# Patient Record
Sex: Male | Born: 1975 | Race: White | Hispanic: No | Marital: Married | Smoking: Never smoker
Health system: Southern US, Community
[De-identification: ages and names within clinical notes are randomized; demographics above are authoritative.]

---

## 2006-08-31 ENCOUNTER — Ambulatory Visit: Payer: Self-pay | Admitting: Family Medicine

## 2006-09-30 ENCOUNTER — Ambulatory Visit: Payer: Self-pay | Admitting: Family Medicine

## 2007-02-06 ENCOUNTER — Ambulatory Visit: Payer: Self-pay | Admitting: Family Medicine

## 2012-06-22 DIAGNOSIS — F988 Other specified behavioral and emotional disorders with onset usually occurring in childhood and adolescence: Secondary | ICD-10-CM | POA: Insufficient documentation

## 2013-10-24 ENCOUNTER — Emergency Department (HOSPITAL_COMMUNITY)
Admission: EM | Admit: 2013-10-24 | Discharge: 2013-10-24 | Disposition: A | Discharge status: Home / Self Care | Payer: Self-pay | Attending: Emergency Medicine | Admitting: Emergency Medicine

## 2013-10-24 ENCOUNTER — Encounter (HOSPITAL_COMMUNITY): Payer: Self-pay | Admitting: Emergency Medicine

## 2013-10-24 ENCOUNTER — Emergency Department (HOSPITAL_COMMUNITY): Payer: Self-pay

## 2013-10-24 DIAGNOSIS — Z79899 Other long term (current) drug therapy: Secondary | ICD-10-CM | POA: Insufficient documentation

## 2013-10-24 DIAGNOSIS — N201 Calculus of ureter: Secondary | ICD-10-CM | POA: Insufficient documentation

## 2013-10-24 DIAGNOSIS — Z91040 Latex allergy status: Secondary | ICD-10-CM | POA: Insufficient documentation

## 2013-10-24 LAB — COMPREHENSIVE METABOLIC PANEL
AST: 22 U/L (ref 0–37)
Albumin: 4.6 g/dL (ref 3.5–5.2)
Alkaline Phosphatase: 67 U/L (ref 39–117)
Calcium: 9.3 mg/dL (ref 8.4–10.5)
Chloride: 99 mEq/L (ref 96–112)
GFR calc Af Amer: >90 mL/min (ref >90)
Potassium: 3.9 mEq/L (ref 3.5–5.1)
Total Bilirubin: 0.4 mg/dL (ref 0.3–1.2)

## 2013-10-24 LAB — CBC WITH DIFFERENTIAL/PLATELET
Basophils Absolute: 0 10*3/uL (ref 0.0–0.1)
Basophils Relative: 0 % (ref 0–1)
Eosinophils Relative: 2 % (ref 0–5)
Hemoglobin: 15.7 g/dL (ref 13.0–17.0)
Lymphocytes Relative: 21 % (ref 12–46)
MCHC: 36.2 g/dL — ABNORMAL HIGH (ref 30.0–36.0)
MCV: 87.5 fL (ref 78.0–100.0)
Monocytes Relative: 7 % (ref 3–12)
Neutro Abs: 7.7 10*3/uL (ref 1.7–7.7)
Neutrophils Relative %: 70 % (ref 43–77)
RBC: 4.96 MIL/uL (ref 4.22–5.81)
RDW: 12.3 % (ref 11.5–15.5)
WBC: 11 10*3/uL — ABNORMAL HIGH (ref 4.0–10.5)

## 2013-10-24 LAB — URINALYSIS, ROUTINE W REFLEX MICROSCOPIC
Glucose, UA: NEGATIVE mg/dL
Hgb urine dipstick: NEGATIVE
Ketones, ur: 15 mg/dL — AB
Nitrite: NEGATIVE
Protein, ur: NEGATIVE mg/dL
Specific Gravity, Urine: 1.029 (ref 1.005–1.030)
Urobilinogen, UA: 1 mg/dL (ref 0.0–1.0)

## 2013-10-24 MED ORDER — HYDROMORPHONE HCL PF 1 MG/ML IJ SOLN
1.0000 mg | Freq: Once | INTRAMUSCULAR | Status: AC
  Administered 2013-10-24: 1 mg via INTRAVENOUS
  Filled 2013-10-24: qty 1

## 2013-10-24 MED ORDER — ONDANSETRON HCL 8 MG PO TABS: 8.0000 mg | ORAL_TABLET | Freq: Three times a day (TID) | ORAL | Status: DC | PRN

## 2013-10-24 MED ORDER — ONDANSETRON HCL 4 MG/2ML IJ SOLN
4.0000 mg | Freq: Once | INTRAMUSCULAR | Status: AC
  Administered 2013-10-24: 4 mg via INTRAVENOUS
  Filled 2013-10-24: qty 2

## 2013-10-24 MED ORDER — OXYCODONE-ACETAMINOPHEN 5-325 MG PO TABS: 2.0000 | ORAL_TABLET | ORAL | Status: DC | PRN

## 2013-10-24 MED ORDER — FENTANYL CITRATE 0.05 MG/ML IJ SOLN
INTRAMUSCULAR | Status: AC
  Filled 2013-10-24: qty 2

## 2013-10-24 MED ORDER — FENTANYL CITRATE 0.05 MG/ML IJ SOLN
50.0000 ug | Freq: Once | INTRAMUSCULAR | Status: AC
  Administered 2013-10-24: 50 ug via INTRAVENOUS

## 2013-10-24 NOTE — ED Provider Notes (Signed)
CSN: [id number]     Arrival date & time 10/24/13  1828 History   First MD Initiated Contact with Patient 10/24/13 1847     Chief Complaint  Patient presents with  . Flank Pain    Patient is a 37 y.o. male presenting with flank pain. The history is provided by the patient.  Flank Pain This is a new problem. The current episode started 3 to 5 hours ago. The problem occurs constantly. The problem has been gradually worsening. Associated symptoms include abdominal pain. Pertinent negatives include no chest pain and no shortness of breath. Exacerbated by: palpation. Nothing relieves the symptoms. He has tried rest for the symptoms. The treatment provided no relief.  pt reports sudden onset of right flank pain/RLQ pain earlier He has never had this before No h/o kidney stones He reports nausea No vomiting   PMH - none History reviewed. No pertinent past surgical history. History reviewed. No pertinent family history. History  Substance Use Topics  . Smoking status: Never Smoker   . Smokeless tobacco: Not on file  . Alcohol Use: No    Review of Systems  Constitutional: Negative for fever.  Respiratory: Negative for shortness of breath.   Cardiovascular: Negative for chest pain.  Gastrointestinal: Positive for abdominal pain.  Genitourinary: Positive for urgency and flank pain. Negative for dysuria.  Neurological: Negative for weakness.  All other systems reviewed and are negative.    Allergies  Latex  Home Medications   Current Outpatient Rx  Name  Route  Sig  Dispense  Refill  . amphetamine-dextroamphetamine (ADDERALL XR) 30 MG 24 hr capsule   Oral   Take 30 mg by mouth daily.         . citalopram (CELEXA) 10 MG tablet   Oral   Take 10 mg by mouth daily.         Marland Kitchen guaiFENesin (MUCINEX) 600 MG 12 hr tablet   Oral   Take 600 mg by mouth daily as needed for cough or to loosen phlegm.         Marland Kitchen ibuprofen (ADVIL,MOTRIN) 200 MG tablet   Oral   Take 400 mg by  mouth every 6 (six) hours as needed for headache or mild pain.          BP 121/68  Pulse 82  Temp(Src) 98.2 F (36.8 C) (Oral)  Resp 18  SpO2 92% Physical Exam CONSTITUTIONAL: Well developed/well nourished HEAD: Normocephalic/atraumatic EYES: EOMI/PERRL ENMT: Mucous membranes moist NECK: supple no meningeal signs SPINE:entire spine nontender CV: S1/S2 noted, no murmurs/rubs/gallops noted LUNGS: Lungs are clear to auscultation bilaterally, no apparent distress ABDOMEN: soft, mild RLQ tenderness noted, no rebound or guarding WU:[id number] cva tenderness, no inguinal hernia, no testicular tenderness.   NEURO: Pt is awake/alert, moves all extremitiesx4 EXTREMITIES: pulses normal, full ROM SKIN: warm, color normal PSYCH: no abnormalities of mood noted  ED Course  Procedures (including critical care time)  8:39 PM Pt uncomfortable appearing, will proceed with Ct imaging  Pt improved in the Ed We discussed CT findings and need for outpatient followup Pt agreeable with plan Labs Review Labs Reviewed  CBC WITH DIFFERENTIAL - Abnormal; Notable for the following:    WBC 11.0 (*)    MCHC 36.2 (*)    All other components within normal limits  COMPREHENSIVE METABOLIC PANEL - Abnormal; Notable for the following:    Glucose, Bld 119 (*)    All other components within normal limits  URINALYSIS, ROUTINE W REFLEX MICROSCOPIC - Abnormal;  Notable for the following:    Ketones, ur 15 (*)    All other components within normal limits   Imaging Review No results found.  EKG Interpretation   None       MDM  No diagnosis found. Nursing notes including past medical history and social history reviewed and considered in documentation Labs/vital reviewed and considered     Joya Gaskins, MD 10/24/13 2336

## 2013-10-24 NOTE — ED Notes (Signed)
Patient return from CT

## 2013-10-24 NOTE — ED Notes (Signed)
Pt c/o R sided ABD pain, R flank pain, and dysuria since 1600 today

## 2013-10-24 NOTE — ED Notes (Signed)
Patient transported to CT 

## 2013-10-24 NOTE — ED Notes (Signed)
Pt c/o RLQ pain starting today; pt sts started today with nausea; pt appears pale and diaphoretic

## 2013-10-24 NOTE — ED Notes (Signed)
MD at bedside. 

## 2014-06-18 DIAGNOSIS — E785 Hyperlipidemia, unspecified: Secondary | ICD-10-CM | POA: Insufficient documentation

## 2014-10-04 ENCOUNTER — Encounter (HOSPITAL_COMMUNITY): Payer: Self-pay | Admitting: Emergency Medicine

## 2014-10-04 ENCOUNTER — Emergency Department (HOSPITAL_COMMUNITY)
Admission: EM | Admit: 2014-10-04 | Discharge: 2014-10-04 | Disposition: A | Discharge status: Home / Self Care | Payer: 59 | Source: Home / Self Care | Attending: Emergency Medicine | Admitting: Emergency Medicine

## 2014-10-04 DIAGNOSIS — N200 Calculus of kidney: Secondary | ICD-10-CM

## 2014-10-04 LAB — POCT I-STAT, CHEM 8
BUN: 14 mg/dL (ref 6–23)
CALCIUM ION: 1.18 mmol/L (ref 1.12–1.23)
Chloride: 103 mEq/L (ref 96–112)
Creatinine, Ser: 1 mg/dL (ref 0.50–1.35)
Glucose, Bld: 99 mg/dL (ref 70–99)
HCT: 49 % (ref 39.0–52.0)
Hemoglobin: 16.7 g/dL (ref 13.0–17.0)
Potassium: 4.2 mEq/L (ref 3.7–5.3)
Sodium: 137 mEq/L (ref 137–147)
TCO2: 23 mmol/L (ref 0–100)

## 2014-10-04 LAB — CBC WITH DIFFERENTIAL/PLATELET
BASOS PCT: 1 % (ref 0–1)
Basophils Absolute: 0.1 10*3/uL (ref 0.0–0.1)
EOS PCT: 3 % (ref 0–5)
Eosinophils Absolute: 0.3 10*3/uL (ref 0.0–0.7)
HEMATOCRIT: 45.4 % (ref 39.0–52.0)
HEMOGLOBIN: 16.3 g/dL (ref 13.0–17.0)
Lymphocytes Relative: 27 % (ref 12–46)
Lymphs Abs: 2.2 10*3/uL (ref 0.7–4.0)
MCH: 31.7 pg (ref 26.0–34.0)
MCHC: 35.9 g/dL (ref 30.0–36.0)
MCV: 88.3 fL (ref 78.0–100.0)
MONO ABS: 0.8 10*3/uL (ref 0.1–1.0)
Monocytes Relative: 9 % (ref 3–12)
Neutro Abs: 5 10*3/uL (ref 1.7–7.7)
Neutrophils Relative %: 60 % (ref 43–77)
Platelets: 293 10*3/uL (ref 150–400)
RBC: 5.14 MIL/uL (ref 4.22–5.81)
RDW: 12.3 % (ref 11.5–15.5)
WBC: 8.3 10*3/uL (ref 4.0–10.5)

## 2014-10-04 LAB — POCT URINALYSIS DIP (DEVICE)
Bilirubin Urine: NEGATIVE
Glucose, UA: NEGATIVE mg/dL
HGB URINE DIPSTICK: NEGATIVE
Ketones, ur: NEGATIVE mg/dL
Leukocytes, UA: NEGATIVE
Nitrite: NEGATIVE
PROTEIN: NEGATIVE mg/dL
SPECIFIC GRAVITY, URINE: 1.02 (ref 1.005–1.030)
UROBILINOGEN UA: 0.2 mg/dL (ref 0.0–1.0)
pH: 5.5 (ref 5.0–8.0)

## 2014-10-04 MED ORDER — HYDROMORPHONE HCL 1 MG/ML IJ SOLN
INTRAMUSCULAR | Status: AC
  Filled 2014-10-04: qty 1

## 2014-10-04 MED ORDER — ONDANSETRON 4 MG PO TBDP
8.0000 mg | ORAL_TABLET | Freq: Once | ORAL | Status: AC
  Administered 2014-10-04: 8 mg via ORAL

## 2014-10-04 MED ORDER — OXYCODONE-ACETAMINOPHEN 5-325 MG PO TABS: ORAL_TABLET | ORAL | Status: DC

## 2014-10-04 MED ORDER — ONDANSETRON 4 MG PO TBDP
ORAL_TABLET | ORAL | Status: AC
  Filled 2014-10-04: qty 2

## 2014-10-04 MED ORDER — ONDANSETRON 8 MG PO TBDP: 8.0000 mg | ORAL_TABLET | Freq: Three times a day (TID) | ORAL | Status: DC | PRN

## 2014-10-04 MED ORDER — KETOROLAC TROMETHAMINE 60 MG/2ML IM SOLN
INTRAMUSCULAR | Status: AC
  Filled 2014-10-04: qty 2

## 2014-10-04 MED ORDER — TAMSULOSIN HCL 0.4 MG PO CAPS: 0.4000 mg | ORAL_CAPSULE | Freq: Every day | ORAL | Status: DC

## 2014-10-04 MED ORDER — KETOROLAC TROMETHAMINE 60 MG/2ML IM SOLN
60.0000 mg | Freq: Once | INTRAMUSCULAR | Status: AC
Start: 2014-10-04 — End: 2014-10-04
  Administered 2014-10-04: 60 mg via INTRAMUSCULAR

## 2014-10-04 MED ORDER — HYDROMORPHONE HCL 1 MG/ML IJ SOLN
2.0000 mg | Freq: Once | INTRAMUSCULAR | Status: AC
  Administered 2014-10-04: 2 mg via INTRAMUSCULAR

## 2014-10-04 NOTE — ED Notes (Addendum)
C/o right flank pain onset 2 hours ago; radiates to the back Sx also include: urinary retention, chills, diaphoresis Hx kidney stones Denies fevers Alert, no signs of acute distress.

## 2014-10-04 NOTE — Discharge Instructions (Signed)

## 2014-10-04 NOTE — ED Provider Notes (Signed)
Chief Complaint   Back Pain   History of Present Illness   Peter Cline is a 38 year old male who 2 hours ago experienced sudden onset of right flank pain rating towards the right CVA area while running to work. He denies any injury. The pain radiated to the right lower back. The patient has a history kidney stone on the right side a year ago and this feels identical to his kidney stone. He's felt somewhat chilled and sweaty but not had any fever. He denies any dysuria, frequency, urgency, hematuria. He's had a little bit of difficulty urinating. He denies any nausea or vomiting. He's had no constipation, diarrhea, blood in the stool.  Review of Systems   Other than as noted above, the patient denies any of the following symptoms: Constitutional:  No fever, chills, weight loss or anorexia. Abdomen:  No nausea, vomiting, hematememesis, melena, diarrhea, or hematochezia. GU:  No dysuria, frequency, urgency, or hematuria.  No testicular pain or swelling.  PMFSH   Past medical history, family history, social history, meds, and allergies were reviewed.   Physical Examination     Vital signs:  BP 141/90  Pulse 102  Temp(Src) 98.3 F (36.8 C) (Oral)  Resp 18  SpO2 100% Gen:  Alert, oriented, in no distress. Lungs:  Breath sounds clear and equal bilaterally.  No wheezes, rales or rhonchi. Heart:  Regular rhythm.  No gallops or murmers.   Abdomen:  Abdomen was soft, flat, nondistended. No organomegaly or mass. Bowel sounds are normal. He has pain to palpation in the right flank without guarding or rebound. Also has slight pain in the right upper quadrant and right lower quadrant without guarding or rebound. No pain in the left side. Back: There is CVA tenderness to percussion on the right, none on the left. Skin:  Clear, warm and dry.  No rash.  Labs   Results for orders placed during the hospital encounter of 10/04/14  CBC WITH DIFFERENTIAL      Result Value Ref Range   WBC  8.3  4.0 - 10.5 K/uL   RBC 5.14  4.22 - 5.81 MIL/uL   Hemoglobin 16.3  13.0 - 17.0 g/dL   HCT 21.345.4  08.639.0 - 57.852.0 %   MCV 88.3  78.0 - 100.0 fL   MCH 31.7  26.0 - 34.0 pg   MCHC 35.9  30.0 - 36.0 g/dL   RDW 46.912.3  62.911.5 - 52.815.5 %   Platelets 293  150 - 400 K/uL   Neutrophils Relative % 60  43 - 77 %   Neutro Abs 5.0  1.7 - 7.7 K/uL   Lymphocytes Relative 27  12 - 46 %   Lymphs Abs 2.2  0.7 - 4.0 K/uL   Monocytes Relative 9  3 - 12 %   Monocytes Absolute 0.8  0.1 - 1.0 K/uL   Eosinophils Relative 3  0 - 5 %   Eosinophils Absolute 0.3  0.0 - 0.7 K/uL   Basophils Relative 1  0 - 1 %   Basophils Absolute 0.1  0.0 - 0.1 K/uL  POCT I-STAT, CHEM 8      Result Value Ref Range   Sodium 137  137 - 147 mEq/L   Potassium 4.2  3.7 - 5.3 mEq/L   Chloride 103  96 - 112 mEq/L   BUN 14  6 - 23 mg/dL   Creatinine, Ser 4.131.00  0.50 - 1.35 mg/dL   Glucose, Bld 99  70 -  99 mg/dL   Calcium, Ion 1.611.18  0.961.12 - 1.23 mmol/L   TCO2 23  0 - 100 mmol/L   Hemoglobin 16.7  13.0 - 17.0 g/dL   HCT 04.549.0  40.939.0 - 81.152.0 %  POCT URINALYSIS DIP (DEVICE)      Result Value Ref Range   Glucose, UA NEGATIVE  NEGATIVE mg/dL   Bilirubin Urine NEGATIVE  NEGATIVE   Ketones, ur NEGATIVE  NEGATIVE mg/dL   Specific Gravity, Urine 1.020  1.005 - 1.030   Hgb urine dipstick NEGATIVE  NEGATIVE   pH 5.5  5.0 - 8.0   Protein, ur NEGATIVE  NEGATIVE mg/dL   Urobilinogen, UA 0.2  0.0 - 1.0 mg/dL   Nitrite NEGATIVE  NEGATIVE   Leukocytes, UA NEGATIVE  NEGATIVE    Course in Urgent Care Center   The following medications were given:  Medications  HYDROmorphone (DILAUDID) injection 2 mg (2 mg Intramuscular Given 10/04/14 1834)  ketorolac (TORADOL) injection 60 mg (60 mg Intramuscular Given 10/04/14 1835)  ondansetron (ZOFRAN-ODT) disintegrating tablet 8 mg (8 mg Oral Given 10/04/14 1835)    The patient felt better after the above medications.  Assessment   The encounter diagnosis was Kidney stone.  Differential diagnosis  includes gastroenteritis, diverticulitis, early appendicitis, pancreatitis, or gallstones. Appendicitis, diverticulitis, or gallstones are unlikely given normal CBC. The normal UA does not preclude a kidney stone, since over third of people with proven kidney stones will have a normal urine.  Plan     1.  Meds:  The following meds were prescribed:   New Prescriptions   ONDANSETRON (ZOFRAN ODT) 8 MG DISINTEGRATING TABLET    Take 1 tablet (8 mg total) by mouth every 8 (eight) hours as needed for nausea.   OXYCODONE-ACETAMINOPHEN (PERCOCET) 5-325 MG PER TABLET    1 to 2 tablets every 6 hours as needed for pain.   TAMSULOSIN (FLOMAX) 0.4 MG CAPS CAPSULE    Take 1 capsule (0.4 mg total) by mouth daily.    2.  Patient Education/Counseling:  The patient was given appropriate handouts, self care instructions, and instructed in symptomatic relief.  He is to rest this weekend, get extra fluids, and strain his urine.  3.  Follow up:  The patient was told to follow up here if no better in 2 to 3 days, or sooner if becoming worse in any way, and given some red flag symptoms such as worsening pain, persistent vomiting, fever, or evidence of GI bleeding which would prompt immediate return. He was given strict return cautions to return if the pain gets worse or if he develops any fever or persistent vomiting. The pain hasn't gone away completely by Monday, he was given the name of urologist to followup with.      Reuben Likesavid C Horris Speros, MD 10/04/14 [phone]

## 2014-10-06 LAB — URINE CULTURE: Special Requests: NORMAL

## 2015-06-21 IMAGING — CT CT ABD-PELV W/O CM
2 of 4 series · 17 of 46 positions shown, 19 images · non-contrast
Comparison: None.

CLINICAL DATA: Right abdomen and flank pain.  Dysuria.

EXAM:
CT ABDOMEN AND PELVIS WITHOUT CONTRAST
TECHNIQUE: Multidetector CT imaging of the abdomen and pelvis was performed
following the standard protocol without intravenous contrast.

[Series 2: routine · axial · 0.86mm/px · z∈[-585,-105]mm · 14 of 106 slices shown, 16 images]
[im 5/106  soft-tissue]
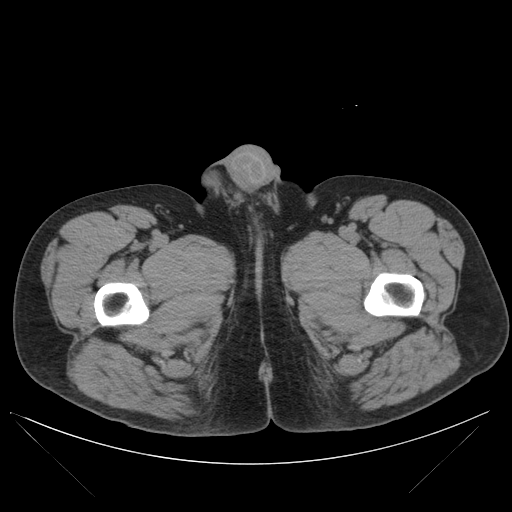
[im 5/106  bone]
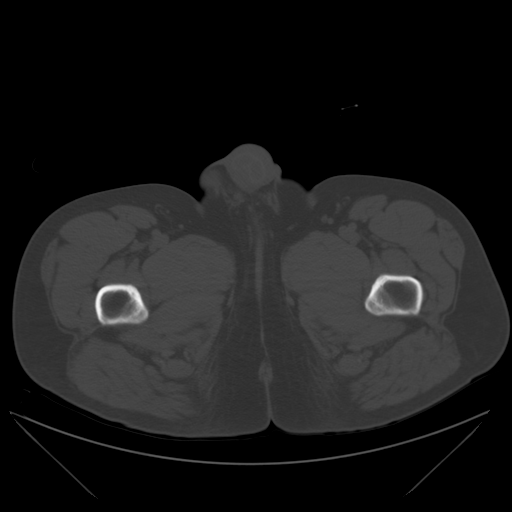
[im 14/106  soft-tissue]
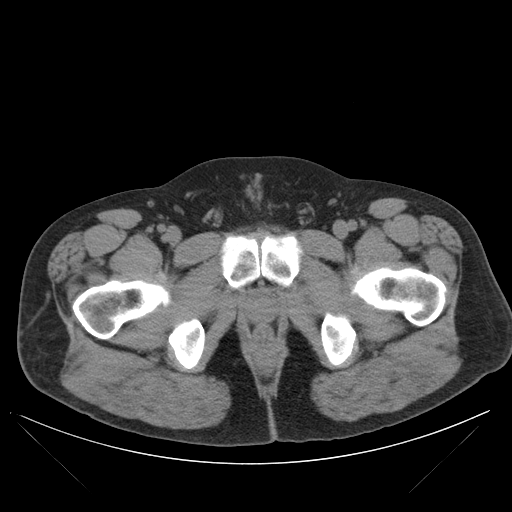
[im 19/106  soft-tissue]
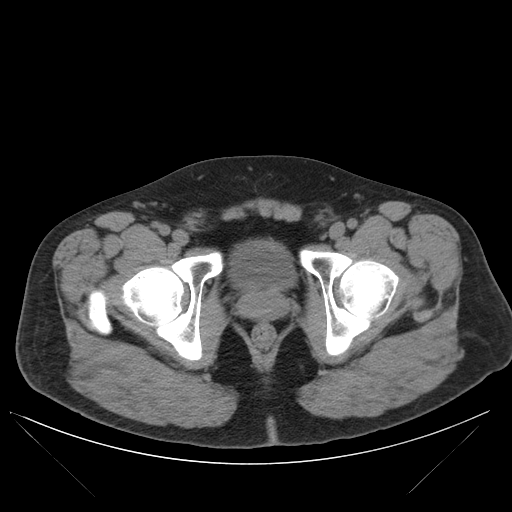
[im 28/106  soft-tissue]
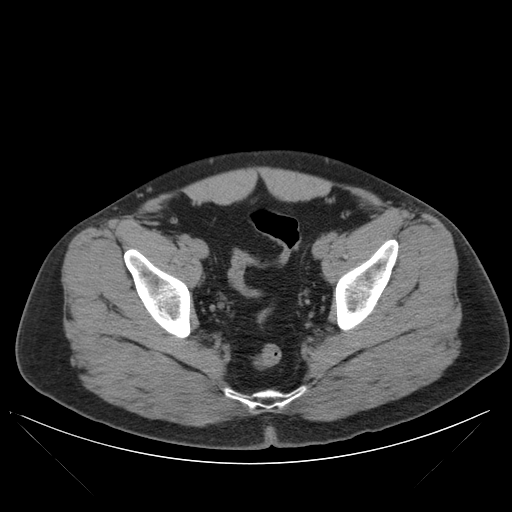
[im 37/106  soft-tissue]
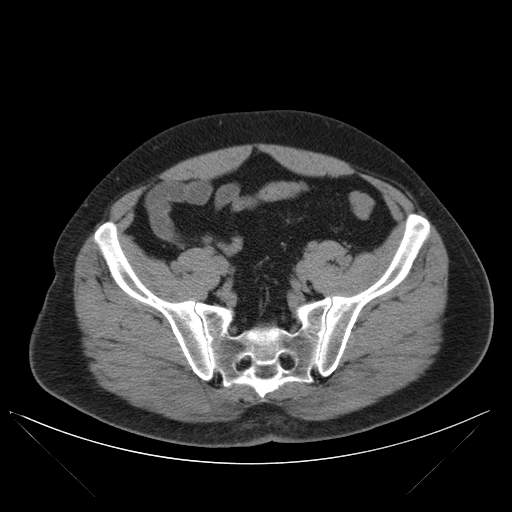
[im 42/106  soft-tissue]
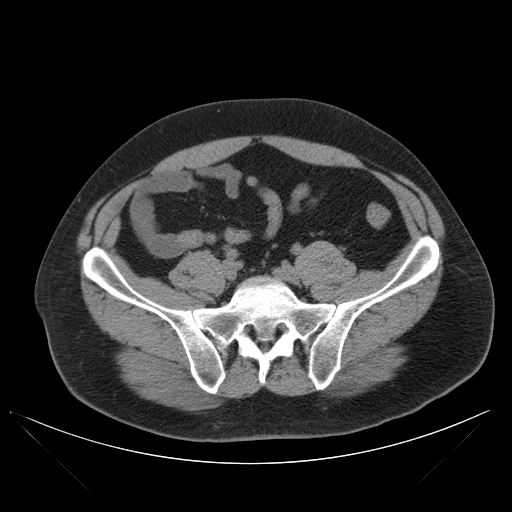
[im 51/106  soft-tissue]
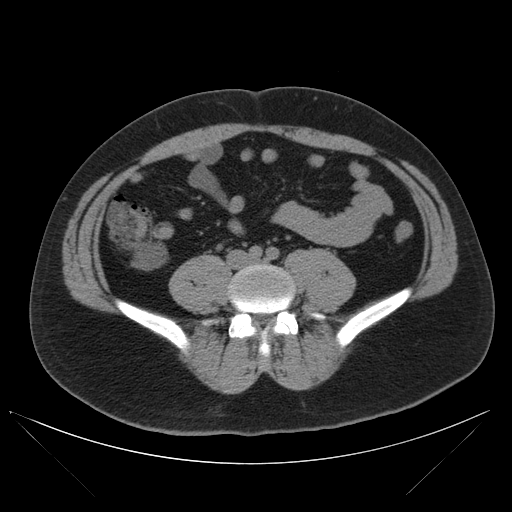
[im 55/106  soft-tissue]
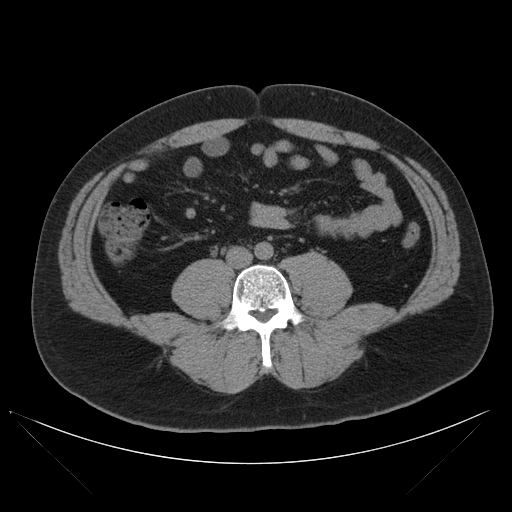
[im 64/106  soft-tissue]
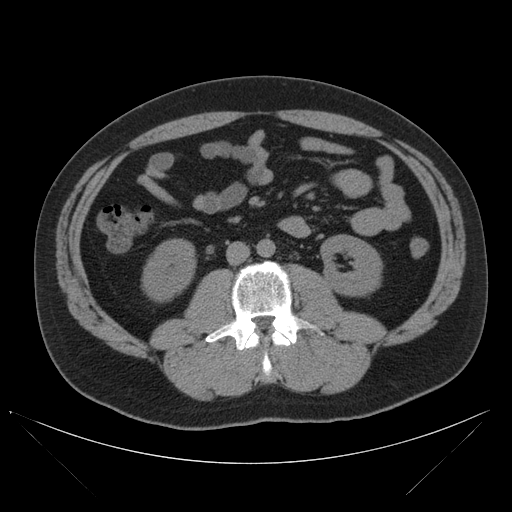
[im 64/106  bone]
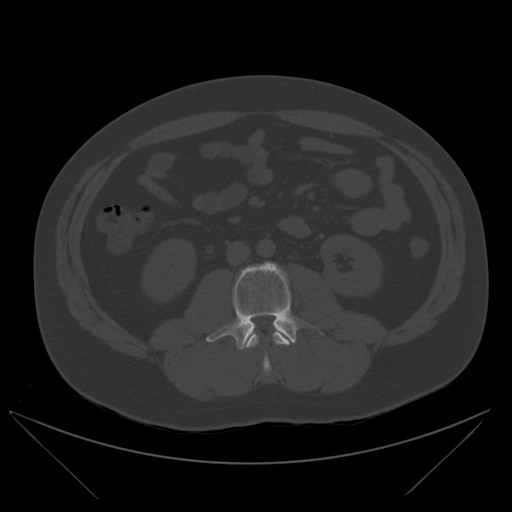
[im 69/106  soft-tissue]
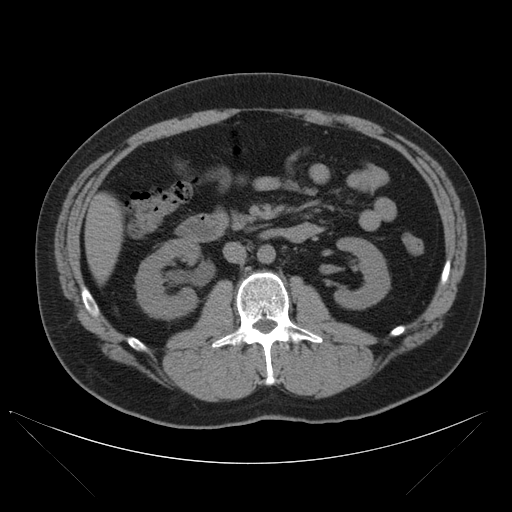
[im 78/106  soft-tissue]
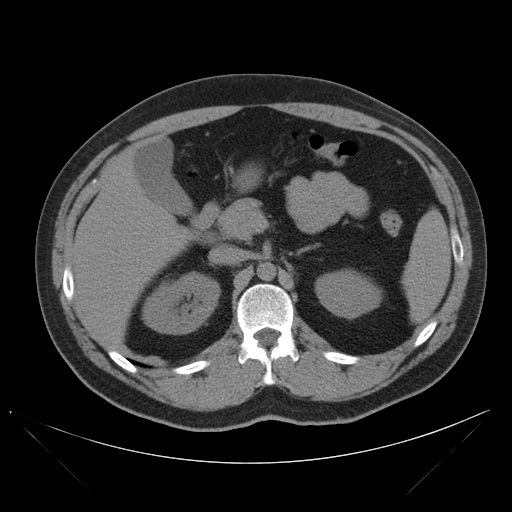
[im 87/106  soft-tissue]
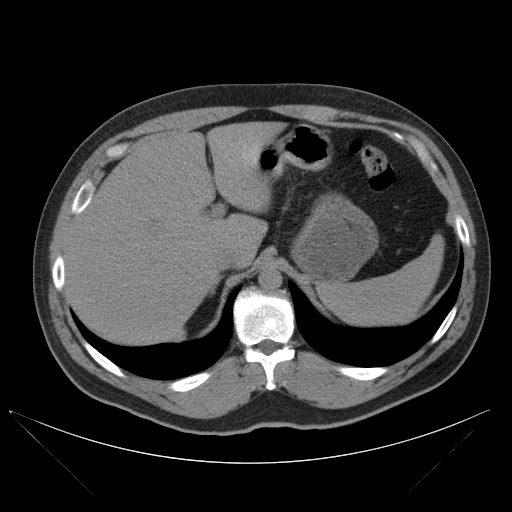
[im 92/106  soft-tissue]
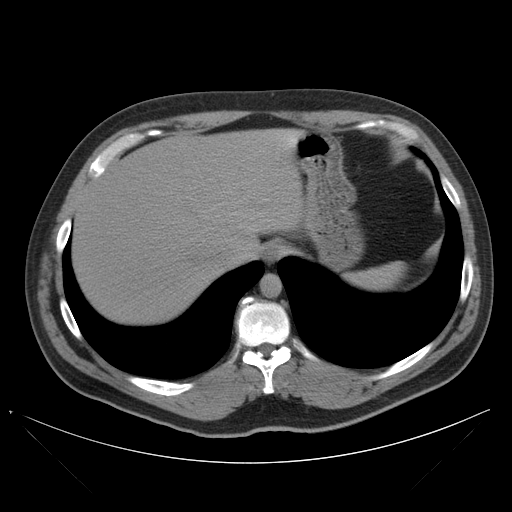
[im 101/106  soft-tissue]
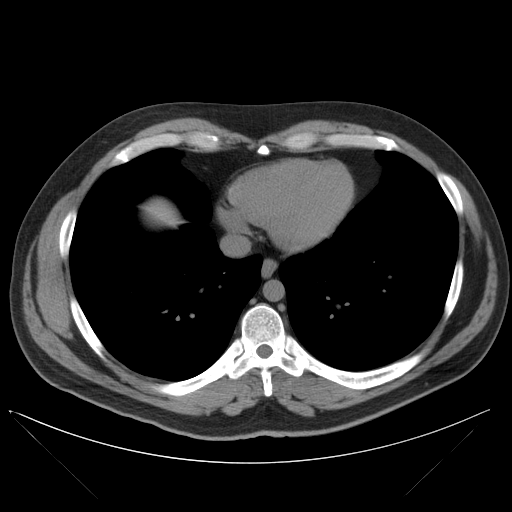

[mpr, coronals, coronal · coronal · 1.03mm/px · 3 of 119 slices shown]
[im 40/119  soft-tissue]
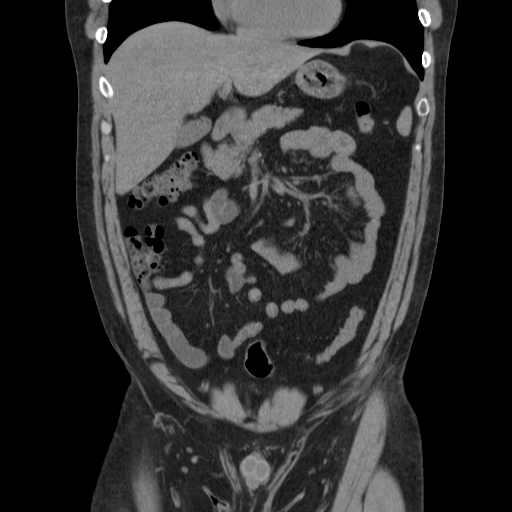
[im 53/119  soft-tissue]
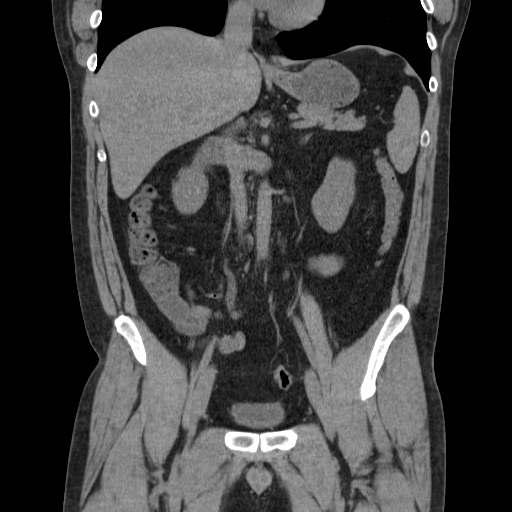
[im 66/119  soft-tissue]
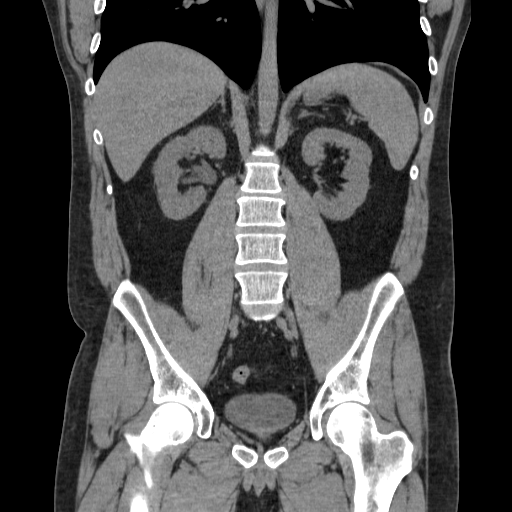

[17 of 46 positions shown; findings below may reference images not displayed]

FINDINGS: Mild dilatation of the right renal collecting system and ureter to
the level of a 3 mm calculus at the ureterovesical junction. No
renal or left ureteral calculi are seen.

Unremarkable non contrasted appearance of the liver, spleen,
pancreas, gallbladder, adrenal glands and prostate gland. No
gastrointestinal abnormalities or enlarged lymph nodes. Clear lung
bases. Lumbar and lower thoracic spine degenerative changes.
IMPRESSION: 3 mm calculus at the right ureterovesical junction, causing mild
right hydronephrosis and hydroureter.

## 2016-04-12 ENCOUNTER — Emergency Department (HOSPITAL_COMMUNITY): Payer: Worker's Compensation

## 2016-04-12 ENCOUNTER — Encounter (HOSPITAL_COMMUNITY): Payer: Self-pay | Admitting: Emergency Medicine

## 2016-04-12 ENCOUNTER — Emergency Department (HOSPITAL_COMMUNITY)
Admission: EM | Admit: 2016-04-12 | Discharge: 2016-04-13 | Disposition: A | Discharge status: Home / Self Care | Payer: Worker's Compensation | Attending: Emergency Medicine | Admitting: Emergency Medicine

## 2016-04-12 DIAGNOSIS — Y990 Civilian activity done for income or pay: Secondary | ICD-10-CM | POA: Diagnosis not present

## 2016-04-12 DIAGNOSIS — Y9289 Other specified places as the place of occurrence of the external cause: Secondary | ICD-10-CM | POA: Diagnosis not present

## 2016-04-12 DIAGNOSIS — S0182XA Laceration with foreign body of other part of head, initial encounter: Secondary | ICD-10-CM | POA: Insufficient documentation

## 2016-04-12 DIAGNOSIS — S0990XA Unspecified injury of head, initial encounter: Secondary | ICD-10-CM | POA: Diagnosis present

## 2016-04-12 DIAGNOSIS — W228XXA Striking against or struck by other objects, initial encounter: Secondary | ICD-10-CM | POA: Diagnosis not present

## 2016-04-12 DIAGNOSIS — Z79899 Other long term (current) drug therapy: Secondary | ICD-10-CM | POA: Insufficient documentation

## 2016-04-12 DIAGNOSIS — Z91040 Latex allergy status: Secondary | ICD-10-CM | POA: Insufficient documentation

## 2016-04-12 DIAGNOSIS — M795 Residual foreign body in soft tissue: Secondary | ICD-10-CM

## 2016-04-12 DIAGNOSIS — Y9389 Activity, other specified: Secondary | ICD-10-CM | POA: Diagnosis not present

## 2016-04-12 MED ORDER — CEPHALEXIN 500 MG PO CAPS: 500.0000 mg | ORAL_CAPSULE | Freq: Four times a day (QID) | ORAL | Status: DC

## 2016-04-12 NOTE — ED Provider Notes (Signed)
CSN: [id number]     Arrival date & time 04/12/16  2128 History   First MD Initiated Contact with Patient 04/12/16 2256     Chief Complaint  Patient presents with  . Head Injury     (Consider location/radiation/quality/duration/timing/severity/associated sxs/prior Treatment) HPI   Peter Cline is a 40 y.o. male, patient with no pertinent past medical history, presenting to the ED with a head wound. Patient was operating an air hammer at work, when chip of metal flew off and hit him in the forehead. Patient states that there is a small piece of metal in the wound. Patient denies LOC, falls, nausea/vomiting, neck pain, neuro deficits, or any other complaints.  History reviewed. No pertinent past medical history. History reviewed. No pertinent past surgical history. No family history on file. Social History  Substance Use Topics  . Smoking status: Never Smoker   . Smokeless tobacco: None  . Alcohol Use: Yes    Review of Systems  HENT: Negative for facial swelling.   Eyes: Negative for visual disturbance.  Gastrointestinal: Negative for nausea and vomiting.  Musculoskeletal: Negative for neck pain.  Skin: Positive for wound.  Neurological: Negative for dizziness, syncope, weakness, light-headedness, numbness and headaches.  Psychiatric/Behavioral: Negative for confusion.      Allergies  Latex  Home Medications   Prior to Admission medications   Medication Sig Start Date End Date Taking? Authorizing Provider  amphetamine-dextroamphetamine (ADDERALL XR) 30 MG 24 hr capsule Take 30 mg by mouth daily.    Historical Provider, MD  citalopram (CELEXA) 10 MG tablet Take 10 mg by mouth daily.    Historical Provider, MD  guaiFENesin (MUCINEX) 600 MG 12 hr tablet Take 600 mg by mouth daily as needed for cough or to loosen phlegm.    Historical Provider, MD  ibuprofen (ADVIL,MOTRIN) 200 MG tablet Take 400 mg by mouth every 6 (six) hours as needed for headache or mild pain.     Historical Provider, MD  ondansetron (ZOFRAN ODT) 8 MG disintegrating tablet Take 1 tablet (8 mg total) by mouth every 8 (eight) hours as needed for nausea. 10/04/14   Reuben Likesavid C Keller, MD  ondansetron (ZOFRAN) 8 MG tablet Take 1 tablet (8 mg total) by mouth every 8 (eight) hours as needed for nausea. 10/24/13   Zadie Rhineonald Wickline, MD  oxyCODONE-acetaminophen (PERCOCET) 5-325 MG per tablet 1 to 2 tablets every 6 hours as needed for pain. 10/04/14   Reuben Likesavid C Keller, MD  oxyCODONE-acetaminophen (PERCOCET/ROXICET) 5-325 MG per tablet Take 2 tablets by mouth every 4 (four) hours as needed for severe pain. 10/24/13   Zadie Rhineonald Wickline, MD  tamsulosin (FLOMAX) 0.4 MG CAPS capsule Take 1 capsule (0.4 mg total) by mouth daily. 10/04/14   Reuben Likesavid C Keller, MD   BP 150/95 mmHg  Pulse 114  Temp(Src) 98.6 F (37 C) (Oral)  Resp 16  Ht 6' (1.829 m)  Wt 111.131 kg  BMI 33.22 kg/m2  SpO2 99% Physical Exam  Constitutional: He is oriented to person, place, and time. He appears well-developed and well-nourished. No distress.  HENT:  Head: Normocephalic.  Mouth/Throat: Oropharynx is clear and moist.  Small piece of metal noted just under the surface of an eighth of a centimeter laceration on the left forehead.  Eyes: Conjunctivae and EOM are normal. Pupils are equal, round, and reactive to light.  Neck: Normal range of motion. Neck supple.  Cardiovascular: Normal rate, regular rhythm and intact distal pulses.   Pulmonary/Chest: Effort normal. No respiratory distress.  Abdominal: Soft. There is no tenderness. There is no guarding.  Musculoskeletal: He exhibits no edema or tenderness.  Full ROM in all extremities and spine. No paraspinal tenderness.   Lymphadenopathy:    He has no cervical adenopathy.  Neurological: He is alert and oriented to person, place, and time. He has normal reflexes.  No sensory deficits. Strength 5/5 in all extremities. No gait disturbance. Coordination intact. Cranial nerves III-XII  grossly intact.   Skin: Skin is warm and dry. He is not diaphoretic.  Psychiatric: He has a normal mood and affect. His behavior is normal.  Nursing note and vitals reviewed.   ED Course  .Foreign Body Removal Date/Time: 04/12/2016 11:32 PM Performed by: Anselm Pancoast Authorized by: Anselm Pancoast Consent: Verbal consent obtained. Risks and benefits: risks, benefits and alternatives were discussed Consent given by: patient Patient understanding: patient states understanding of the procedure being performed Patient consent: the patient's understanding of the procedure matches consent given Procedure consent: procedure consent matches procedure scheduled Patient identity confirmed: verbally with patient and arm band Body area: skin General location: head/neck Location details: face Anesthesia method: patient declined. Patient sedated: no Patient restrained: no Patient cooperative: yes Localization method: probed and visualized Removal mechanism: hemostat Dressing: antibiotic ointment and dressing applied Tendon involvement: none Depth: subcutaneous Complexity: simple 1 objects recovered. Objects recovered: 0.5cm piece of metal Post-procedure assessment: foreign body removed Patient tolerance: Patient tolerated the procedure well with no immediate complications Comments: Patient was offered local anesthesia, but declined.   (including critical care time)  Imaging Review Dg Facial Bones 1-2 Views  04/12/2016  CLINICAL DATA:  Puncture wound after being struck by an unknown object while using an air hammer. EXAM: FACIAL BONES - 1-2 VIEW COMPARISON:  None. FINDINGS: There is a metallic foreign body in the left frontal region measuring a few mm in size. It probably is at the superficial surface of the bone. No fracture is evident. No orbital foreign body is evident. IMPRESSION: Left frontal foreign body as detailed above. No intraorbital foreign body evident. Electronically Signed   By:  Ellery Plunk M.D.   On: 04/12/2016 22:31   I have personally reviewed and evaluated these images and lab results as part of my medical decision-making.   EKG Interpretation None      MDM   Final diagnoses:  Foreign body (FB) in soft tissue    SEVILLE DOWNS presents with a head wound and foreign body that occurred just prior to arrival.  Patient has no neurologic deficits. No red flag symptoms. No sign of serious head injury. X-ray shows no evidence of fracture. Foreign body removed without incident. Wound edges spontaneously approximated. Wound care and return precautions discussed. Patient voiced understanding of these instructions and is comfortable with discharge.    Anselm Pancoast, PA-C 04/14/16 1715  Shon Baton, MD 04/21/16 [phone]

## 2016-04-12 NOTE — Discharge Instructions (Signed)
You have been seen today for a facial injury with foreign body. The foreign body noted on the x-ray was successfully removed. Please take all of your antibiotics until finished!   You may develop abdominal discomfort or diarrhea from the antibiotic.  You may help offset this with probiotics which you can buy or get in yogurt. Do not eat or take the probiotics until 2 hours after your antibiotic. Refer to the attached documents for proper wound care. Follow up with PCP as needed. Return to ED should symptoms worsen. May use ibuprofen, naproxen, or Tylenol for pain.

## 2016-04-12 NOTE — ED Notes (Addendum)
Pt states he was using an air hammer at work and was nailing sometimes into a metal piece on a truck and a piece of metal came back and lodged into the left side of his forehead. Small piece of metal is in left side of forehead.

## 2016-04-12 NOTE — ED Notes (Signed)
Pt st's he was using a air hammer and a piece of metal flew up and struck him in left forehead.  Pt has a piece of metal protruding from forehead.  No bleeding at this time

## 2017-02-09 ENCOUNTER — Emergency Department (HOSPITAL_COMMUNITY)
Admission: EM | Admit: 2017-02-09 | Discharge: 2017-02-09 | Disposition: A | Discharge status: Home / Self Care | Payer: Worker's Compensation | Attending: Emergency Medicine | Admitting: Emergency Medicine

## 2017-02-09 ENCOUNTER — Encounter (HOSPITAL_COMMUNITY): Payer: Self-pay | Admitting: *Deleted

## 2017-02-09 DIAGNOSIS — Z91040 Latex allergy status: Secondary | ICD-10-CM | POA: Diagnosis not present

## 2017-02-09 DIAGNOSIS — S61431A Puncture wound without foreign body of right hand, initial encounter: Secondary | ICD-10-CM | POA: Insufficient documentation

## 2017-02-09 DIAGNOSIS — Y929 Unspecified place or not applicable: Secondary | ICD-10-CM | POA: Diagnosis not present

## 2017-02-09 DIAGNOSIS — S61411A Laceration without foreign body of right hand, initial encounter: Secondary | ICD-10-CM | POA: Diagnosis present

## 2017-02-09 DIAGNOSIS — W260XXA Contact with knife, initial encounter: Secondary | ICD-10-CM | POA: Insufficient documentation

## 2017-02-09 DIAGNOSIS — Y9389 Activity, other specified: Secondary | ICD-10-CM | POA: Insufficient documentation

## 2017-02-09 DIAGNOSIS — T148XXA Other injury of unspecified body region, initial encounter: Secondary | ICD-10-CM

## 2017-02-09 DIAGNOSIS — Y999 Unspecified external cause status: Secondary | ICD-10-CM | POA: Insufficient documentation

## 2017-02-09 MED ORDER — CEPHALEXIN 250 MG PO CAPS
500.0000 mg | ORAL_CAPSULE | Freq: Once | ORAL | Status: AC
  Administered 2017-02-09: 500 mg via ORAL
  Filled 2017-02-09: qty 2

## 2017-02-09 MED ORDER — LIDOCAINE HCL (PF) 1 % IJ SOLN
5.0000 mL | Freq: Once | INTRAMUSCULAR | Status: AC
  Administered 2017-02-09: 5 mL via INTRADERMAL
  Filled 2017-02-09: qty 5

## 2017-02-09 MED ORDER — CEPHALEXIN 500 MG PO CAPS: 500.0000 mg | ORAL_CAPSULE | Freq: Four times a day (QID) | ORAL | 0 refills | Status: DC

## 2017-02-09 NOTE — Progress Notes (Signed)
Orthopedic Tech Progress Note Patient Details:  Peter EmeryBrandon R Cline 04/19/1976 [id number]  Ortho Devices Type of Ortho Device: Finger splint Ortho Device/Splint Location: RUE index finger Ortho Device/Splint Interventions: Ordered, Application   Jennye MoccasinHughes, Lorin Hauck Craig 02/09/2017, 9:33 PM

## 2017-02-09 NOTE — ED Provider Notes (Signed)
[facility]-EMERGENCY DEPT Provider Note   CSN: [id number] Arrival date & time: 02/09/17  1921     History   Chief Complaint Chief Complaint  Patient presents with  . Laceration   HPI   Blood pressure 153/86, pulse 109, temperature 98.7 F (37.1 C), resp. rate 18, SpO2 100 %.  Peter Cline is a 41 y.o. 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 He states a pocket knife was not clean. Last tetanus shot was within the last year. 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 States it was more of a puncture wound that slice. Is right-hand dominant.  History reviewed. No pertinent past medical history.  There are no active problems to display for this patient.   History reviewed. No pertinent surgical history.     Home Medications    Prior to Admission medications   Medication Sig Start Date End Date Taking? Authorizing Provider  amphetamine-dextroamphetamine (ADDERALL XR) 30 MG 24 hr capsule Take 30 mg by mouth daily.    Historical Provider, MD  cephALEXin (KEFLEX) 500 MG capsule Take 1 capsule (500 mg total) by mouth 4 (four) times daily. 02/09/17   Brooks Kinnan, PA-C  citalopram (CELEXA) 10 MG tablet Take 10 mg by mouth daily.    Historical Provider, MD  guaiFENesin (MUCINEX) 600 MG 12 hr tablet Take 600 mg by mouth daily as needed for cough or to loosen phlegm.    Historical Provider, MD  ibuprofen (ADVIL,MOTRIN) 200 MG tablet Take 400 mg by mouth every 6 (six) hours as needed for headache or mild pain.    Historical Provider, MD  ondansetron (ZOFRAN ODT) 8 MG disintegrating tablet Take 1 tablet (8 mg total) by mouth every 8 (eight) hours as needed for nausea. 10/04/14   Reuben Likes, MD  ondansetron (ZOFRAN) 8 MG tablet Take 1 tablet (8 mg total) by mouth every 8 (eight) hours as needed for nausea.  10/24/13   Zadie Rhine, MD  oxyCODONE-acetaminophen (PERCOCET) 5-325 MG per tablet 1 to 2 tablets every 6 hours as needed for pain. 10/04/14   Reuben Likes, MD  oxyCODONE-acetaminophen (PERCOCET/ROXICET) 5-325 MG per tablet Take 2 tablets by mouth every 4 (four) hours as needed for severe pain. 10/24/13   Zadie Rhine, MD  tamsulosin (FLOMAX) 0.4 MG CAPS capsule Take 1 capsule (0.4 mg total) by mouth daily. 10/04/14   Reuben Likes, MD    Family History No family history on file.  Social History Social History  Substance Use Topics  . Smoking status: Never Smoker  . Smokeless tobacco: Never Used  . Alcohol use Yes     Allergies   Latex   Review of Systems Review of Systems  10 systems reviewed and found to be negative, except as noted in the HPI.   Physical Exam Updated Vital Signs BP 138/81 (BP Location: Left Arm)   Pulse 80   Temp 98.4 F (36.9 C) (Oral)   Resp 18   SpO2 97%   Physical Exam  Constitutional: He is oriented to person, place, and time. He appears well-developed and well-nourished. No distress.  HENT:  Head: Normocephalic and atraumatic.  Mouth/Throat: Oropharynx is clear and moist.  Eyes: Conjunctivae and EOM are normal. Pupils are equal, round, and reactive to light.  Neck: Normal range of motion.  Cardiovascular: Normal rate,  regular rhythm and intact distal pulses.   Pulmonary/Chest: Effort normal and breath sounds normal.  Abdominal: Soft. There is no tenderness.  Musculoskeletal: Normal range of motion.       Hands: 1 cm puncture wound as diagrammed.  Neurovacularly intact with full ROM and strength to each interphalangeal joint (tested in isolation) in both flexion and extension.    Neurological: He is alert and oriented to person, place, and time.  Skin: He is not diaphoretic.  Psychiatric: He has a normal mood and affect.  Nursing note and vitals reviewed.    ED Treatments / Results  Labs (all labs ordered are listed, but  only abnormal results are displayed) Labs Reviewed - No data to display  EKG  EKG Interpretation None       Radiology No results found.  Procedures .Marland Kitchen.Laceration Repair Date/Time: 02/09/2017 9:29 PM Performed by: Wynetta EmeryPISCIOTTA, Equan Cogbill Authorized by: Wynetta EmeryPISCIOTTA, Apolo Cutshaw   Consent:    Consent obtained:  Verbal   Consent given by:  Patient   Risks discussed:  Infection   Alternatives discussed:  No treatment Anesthesia (see MAR for exact dosages):    Anesthesia method:  Local infiltration   Local anesthetic:  Lidocaine 1% w/o epi Laceration details:    Length (cm):  1 Repair type:    Repair type:  Simple Pre-procedure details:    Preparation:  Patient was prepped and draped in usual sterile fashion Treatment:    Area cleansed with:  Saline   Amount of cleaning:  Extensive   Irrigation solution:  Sterile saline   Irrigation volume:  3 liter   Irrigation method:  Pressure wash   Visualized foreign bodies/material removed: no   Skin repair:    Repair method:  Sutures   Suture size:  4-0   Wound skin closure material used: ethylon.   Suture technique:  Simple interrupted   Number of sutures:  1 Approximation:    Approximation:  Loose Post-procedure details:    Dressing:  Antibiotic ointment and splint for protection   (including critical care time)  Medications Ordered in ED Medications  cephALEXin (KEFLEX) capsule 500 mg (500 mg Oral Given 02/09/17 2050)  lidocaine (PF) (XYLOCAINE) 1 % injection 5 mL (5 mLs Intradermal Given 02/09/17 2050)     Initial Impression / Assessment and Plan / ED Course  I have reviewed the triage vital signs and the nursing notes.  Pertinent labs & imaging results that were available during my care of the patient were reviewed by me and considered in my medical decision making (see chart for details).     Vitals:   02/09/17 1925 02/09/17 2127  BP: 153/86 138/81  Pulse: 109 80  Resp: 18 18  Temp: 98.7 F (37.1 C) 98.4 F (36.9 C)    TempSrc:  Oral  SpO2: 100% 97%    Medications  cephALEXin (KEFLEX) capsule 500 mg (500 mg Oral Given 02/09/17 2050)  lidocaine (PF) (XYLOCAINE) 1 % injection 5 mL (5 mLs Intradermal Given 02/09/17 2050)    Peter Cline is 41 y.o. male presenting with Laceration to palm of the right hand. 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 Patient is not immunocompromised. Wound is irrigated profusely with 3 L of normal saline. Will be started on Keflex, loosely closed with one suture. I placed him in a splint and have asked him not to go to work for the next week. Work note is provided. We've had an  extensive discussion of wound care and return precautions and patient verbalized understanding and teach back technique.  Evaluation does not show pathology that would require ongoing emergent intervention or inpatient treatment. Pt is hemodynamically stable and mentating appropriately. Discussed findings and plan with patient/guardian, who agrees with care plan. All questions answered. Return precautions discussed and outpatient follow up given.    Final Clinical Impressions(s) / ED Diagnoses   Final diagnoses:  Puncture wound    New Prescriptions New Prescriptions   CEPHALEXIN (KEFLEX) 500 MG CAPSULE    Take 1 capsule (500 mg total) by mouth 4 (four) times daily.     Wynetta Emery, PA-C 02/09/17 2132    Canary Brim Tegeler, MD 02/09/17 [phone]

## 2017-02-09 NOTE — ED Notes (Signed)
Ortho at bedside.

## 2017-02-09 NOTE — ED Notes (Signed)
Ortho paged. 

## 2017-02-09 NOTE — Discharge Instructions (Signed)
your wound is very prone to infection. Please return to the emergency department at the first sign of any infection as follows: warmth, redness, tenderness, pus, sharp increase in pain, fever, red streaking in the skin  Keep wound dry and do not remove dressing for 24 hours if possible. After that, wash gently morning and night (every 12 hours) with soap and water. Use a topical antibiotic ointment and cover with a bandaid or gauze.    Do NOT use rubbing alcohol or hydrogen peroxide, do not soak the area   Present to your primary care doctor or the urgent care of your choice, or the ED for suture removal in 10 days.

## 2017-02-09 NOTE — ED Notes (Signed)
PA Pisciottta at bedside

## 2017-02-09 NOTE — ED Triage Notes (Signed)
Pt was at work and accidentally cut his hand with a pocket knife. ~3cm lac noted to R palm of hand, minimal bleeding noted, bandage in place

## 2017-02-09 NOTE — Progress Notes (Signed)
Orthopedic Tech Progress Note Patient Details:  Peter Cline 08/21/1976 [id number]  Ortho Devices Type of Ortho Device: Finger splint Ortho Device/Splint Location: RUE index finger Ortho Device/Splint Interventions: Ordered, Application   Peter Cline 02/09/2017, 9:33 PM  

## 2017-12-08 IMAGING — DX DG FACIAL BONES 1-2V
2 series · 2 of 2 positions shown · non-contrast
Comparison: None.

CLINICAL DATA: Puncture wound after being struck by an unknown
object while using an air [employer].

EXAM:
FACIAL BONES - 1-2 VIEW

[facial waters]
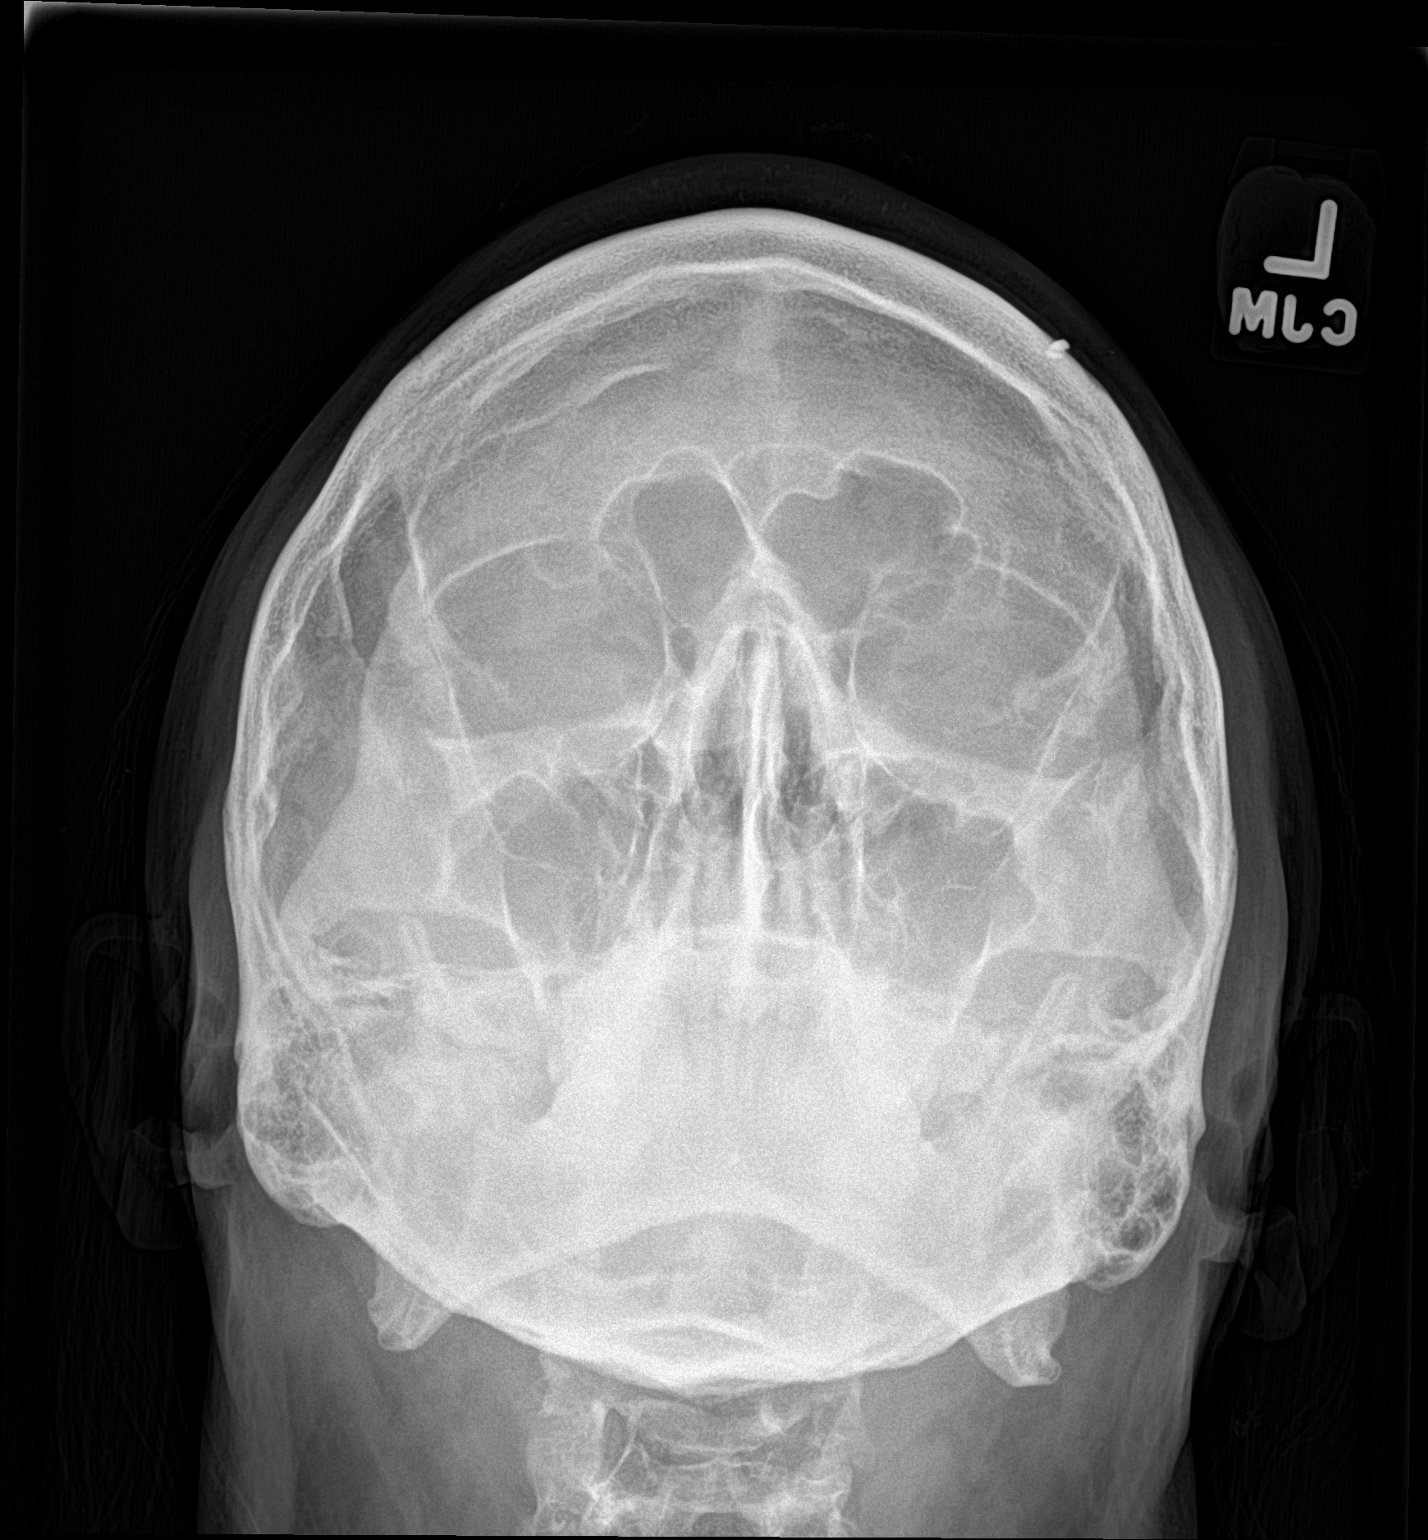

[facial lateral]
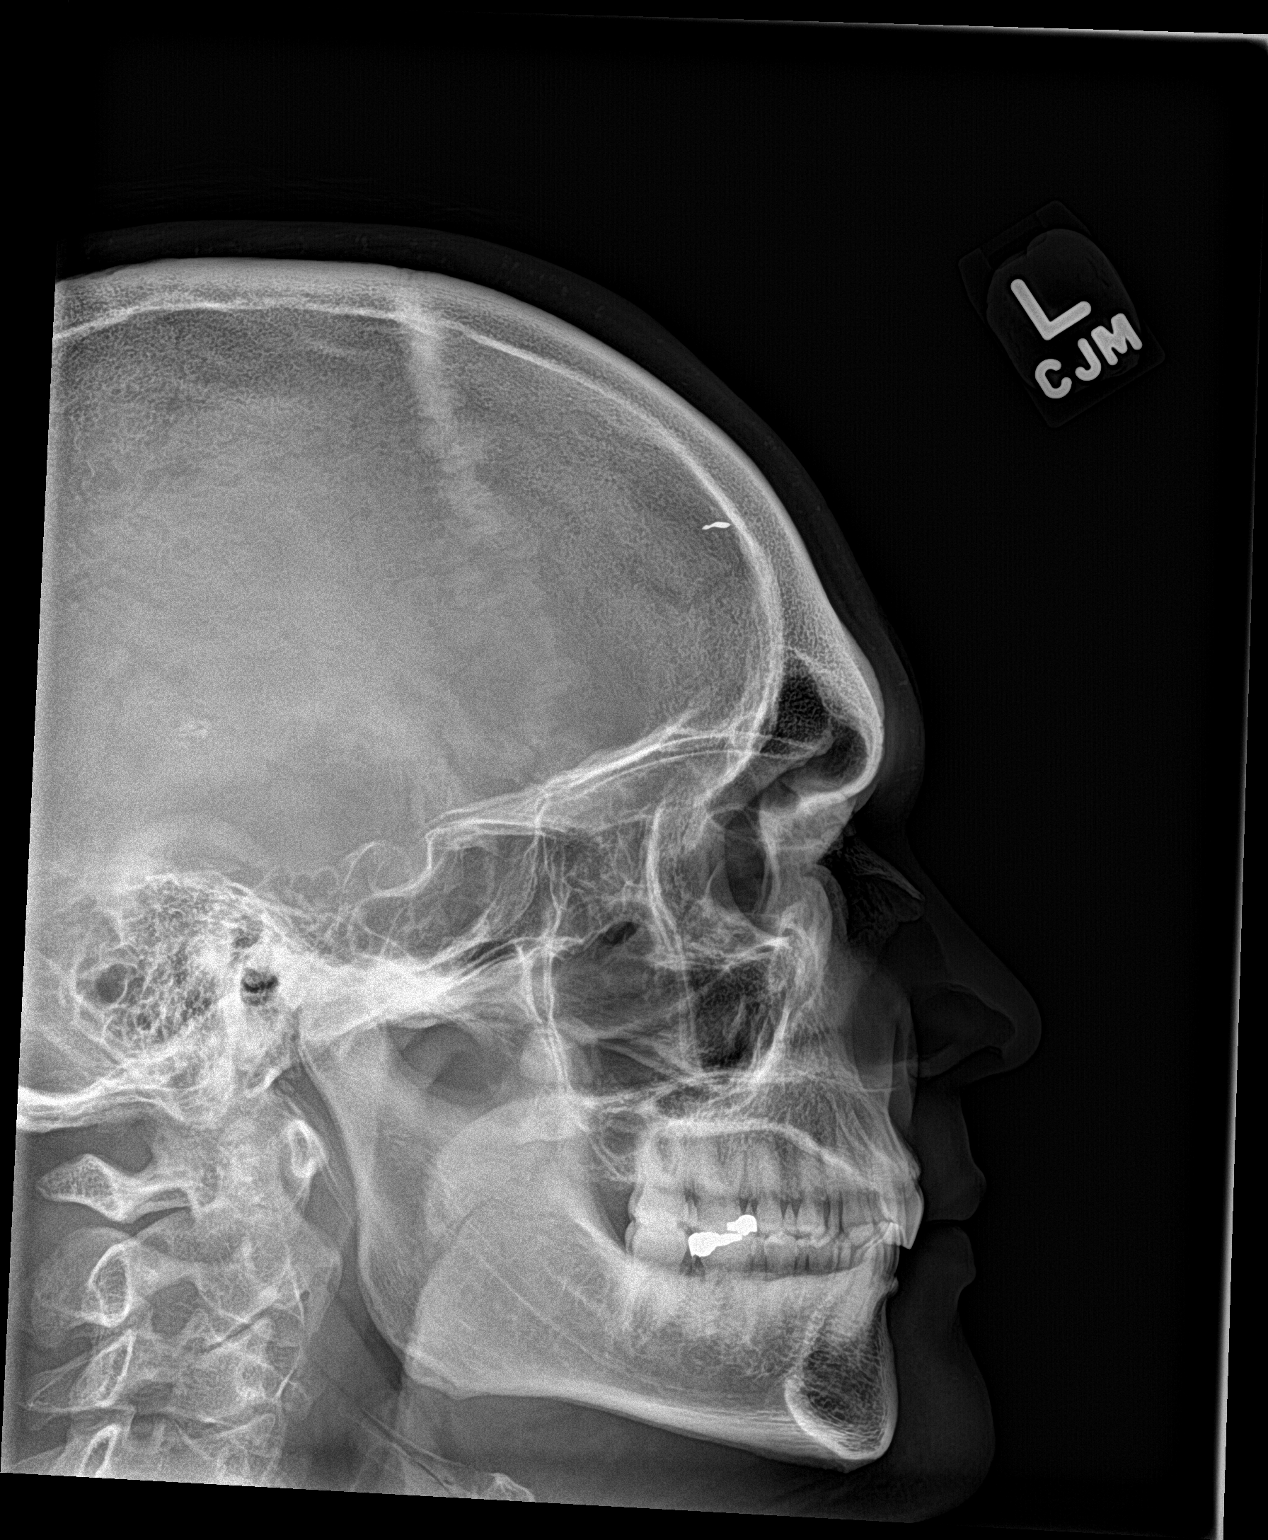

[2 of 2 positions shown; findings below may reference images not displayed]

FINDINGS: There is a metallic foreign body in the left frontal region
measuring a few mm in size. It probably is at the superficial
surface of the bone. No fracture is evident. No orbital foreign body
is evident.
IMPRESSION: Left frontal foreign body as detailed above. No intraorbital foreign
body evident.

## 2018-01-19 DIAGNOSIS — E349 Endocrine disorder, unspecified: Secondary | ICD-10-CM | POA: Insufficient documentation

## 2019-09-24 ENCOUNTER — Other Ambulatory Visit: Payer: Self-pay

## 2019-09-24 DIAGNOSIS — Z20822 Contact with and (suspected) exposure to covid-19: Secondary | ICD-10-CM

## 2019-09-26 LAB — NOVEL CORONAVIRUS, NAA: SARS-CoV-2, NAA: NOT DETECTED

## 2021-02-19 DIAGNOSIS — I10 Essential (primary) hypertension: Secondary | ICD-10-CM | POA: Insufficient documentation

## 2022-11-15 ENCOUNTER — Other Ambulatory Visit (HOSPITAL_BASED_OUTPATIENT_CLINIC_OR_DEPARTMENT_OTHER): Payer: Self-pay

## 2022-11-15 MED ORDER — TESTOSTERONE CYPIONATE 200 MG/ML IM SOLN
INTRAMUSCULAR | 0 refills | Status: DC
  Filled 2022-11-15: qty 2, 28d supply, fill #0
  Filled 2023-03-09: qty 2, 28d supply, fill #1

## 2022-11-15 MED ORDER — PRAVASTATIN SODIUM 40 MG PO TABS
ORAL_TABLET | ORAL | 0 refills | Status: DC
  Filled 2022-11-15: qty 30, 30d supply, fill #0
  Filled 2023-03-09: qty 30, 30d supply, fill #1

## 2022-11-15 MED ORDER — BUPROPION HCL ER (XL) 300 MG PO TB24
ORAL_TABLET | ORAL | 0 refills | Status: DC
  Filled 2022-11-15: qty 30, 30d supply, fill #0
  Filled 2023-03-09: qty 30, 30d supply, fill #1

## 2022-11-15 MED ORDER — AMPHETAMINE-DEXTROAMPHET ER 30 MG PO CP24
ORAL_CAPSULE | ORAL | 0 refills | Status: DC
  Filled 2022-11-15: qty 30, 30d supply, fill #0

## 2022-11-15 MED ORDER — CLONAZEPAM 0.5 MG PO TABS
0.5000 mg | ORAL_TABLET | Freq: Every day | ORAL | 0 refills | Status: DC | PRN
  Filled 2022-11-15: qty 30, 30d supply, fill #0

## 2022-11-15 MED ORDER — EZETIMIBE 10 MG PO TABS
ORAL_TABLET | ORAL | 0 refills | Status: DC
  Filled 2022-11-15: qty 30, 30d supply, fill #0
  Filled 2023-03-09: qty 30, 30d supply, fill #1

## 2022-11-15 MED ORDER — MELOXICAM 15 MG PO TABS
ORAL_TABLET | ORAL | 0 refills | Status: DC
  Filled 2022-11-15: qty 30, 30d supply, fill #0

## 2022-11-15 MED ORDER — CITALOPRAM HYDROBROMIDE 40 MG PO TABS
ORAL_TABLET | ORAL | 0 refills | Status: DC
  Filled 2022-11-15: qty 30, 30d supply, fill #0
  Filled 2023-03-09: qty 30, 30d supply, fill #1

## 2022-11-15 MED ORDER — AMPHETAMINE-DEXTROAMPHETAMINE 10 MG PO TABS
ORAL_TABLET | ORAL | 0 refills | Status: DC
  Filled 2022-11-15: qty 30, 30d supply, fill #0

## 2022-11-23 ENCOUNTER — Other Ambulatory Visit (HOSPITAL_BASED_OUTPATIENT_CLINIC_OR_DEPARTMENT_OTHER): Payer: Self-pay

## 2023-01-25 ENCOUNTER — Other Ambulatory Visit (HOSPITAL_BASED_OUTPATIENT_CLINIC_OR_DEPARTMENT_OTHER): Payer: Self-pay

## 2023-01-25 ENCOUNTER — Other Ambulatory Visit: Payer: Self-pay

## 2023-01-25 MED ORDER — MELOXICAM 15 MG PO TABS
15.0000 mg | ORAL_TABLET | Freq: Every day | ORAL | 0 refills | Status: DC
  Filled 2023-01-25: qty 30, 30d supply, fill #0

## 2023-01-25 MED ORDER — AZELASTINE HCL 0.1 % NA SOLN
NASAL | 12 refills | Status: DC
  Filled 2023-01-25: qty 30, 30d supply, fill #0
  Filled 2023-03-09: qty 30, 30d supply, fill #1

## 2023-01-25 MED ORDER — CLONAZEPAM 0.5 MG PO TABS
0.5000 mg | ORAL_TABLET | Freq: Every day | ORAL | 0 refills | Status: DC | PRN
  Filled 2023-01-25: qty 30, 30d supply, fill #0

## 2023-01-25 MED ORDER — AMPHETAMINE-DEXTROAMPHETAMINE 10 MG PO TABS
10.0000 mg | ORAL_TABLET | Freq: Every day | ORAL | 0 refills | Status: DC
  Filled 2023-01-25: qty 30, 30d supply, fill #0

## 2023-01-25 MED ORDER — TESTOSTERONE CYPIONATE 200 MG/ML IM SOLN
200.0000 mg | INTRAMUSCULAR | 0 refills | Status: DC
  Filled 2023-01-25: qty 2, 28d supply, fill #0
  Filled 2023-03-09: qty 10, 140d supply, fill #1

## 2023-01-25 MED ORDER — BUPROPION HCL ER (XL) 300 MG PO TB24
300.0000 mg | ORAL_TABLET | Freq: Every morning | ORAL | 0 refills | Status: DC
  Filled 2023-01-25: qty 30, 30d supply, fill #0
  Filled 2023-03-09: qty 30, 30d supply, fill #1

## 2023-01-25 MED ORDER — AMPHETAMINE-DEXTROAMPHET ER 30 MG PO CP24
30.0000 mg | ORAL_CAPSULE | Freq: Every morning | ORAL | 0 refills | Status: DC
  Filled 2023-01-25: qty 30, 30d supply, fill #0

## 2023-03-06 ENCOUNTER — Emergency Department (HOSPITAL_BASED_OUTPATIENT_CLINIC_OR_DEPARTMENT_OTHER)
Admission: EM | Admit: 2023-03-06 | Discharge: 2023-03-06 | Disposition: A | Discharge status: Home / Self Care | Payer: 59 | Attending: Emergency Medicine | Admitting: Emergency Medicine

## 2023-03-06 ENCOUNTER — Emergency Department (HOSPITAL_BASED_OUTPATIENT_CLINIC_OR_DEPARTMENT_OTHER): Payer: 59

## 2023-03-06 ENCOUNTER — Emergency Department (HOSPITAL_BASED_OUTPATIENT_CLINIC_OR_DEPARTMENT_OTHER): Payer: 59 | Admitting: Radiology

## 2023-03-06 ENCOUNTER — Other Ambulatory Visit: Payer: Self-pay

## 2023-03-06 DIAGNOSIS — S32029A Unspecified fracture of second lumbar vertebra, initial encounter for closed fracture: Secondary | ICD-10-CM | POA: Diagnosis not present

## 2023-03-06 DIAGNOSIS — R079 Chest pain, unspecified: Secondary | ICD-10-CM | POA: Insufficient documentation

## 2023-03-06 DIAGNOSIS — W14XXXA Fall from tree, initial encounter: Secondary | ICD-10-CM | POA: Diagnosis not present

## 2023-03-06 DIAGNOSIS — S32059A Unspecified fracture of fifth lumbar vertebra, initial encounter for closed fracture: Secondary | ICD-10-CM | POA: Diagnosis not present

## 2023-03-06 DIAGNOSIS — S3992XA Unspecified injury of lower back, initial encounter: Secondary | ICD-10-CM | POA: Diagnosis present

## 2023-03-06 DIAGNOSIS — M25551 Pain in right hip: Secondary | ICD-10-CM | POA: Diagnosis not present

## 2023-03-06 DIAGNOSIS — Z91040 Latex allergy status: Secondary | ICD-10-CM | POA: Diagnosis not present

## 2023-03-06 DIAGNOSIS — M545 Low back pain, unspecified: Secondary | ICD-10-CM | POA: Diagnosis not present

## 2023-03-06 LAB — COMPREHENSIVE METABOLIC PANEL
ALT: 23 U/L (ref 0–44)
AST: 14 U/L — ABNORMAL LOW (ref 15–41)
Albumin: 4.4 g/dL (ref 3.5–5.0)
Alkaline Phosphatase: 55 U/L (ref 38–126)
Anion gap: 8 (ref 5–15)
BUN: 11 mg/dL (ref 6–20)
CO2: 26 mmol/L (ref 22–32)
Calcium: 9.2 mg/dL (ref 8.9–10.3)
Chloride: 104 mmol/L (ref 98–111)
Creatinine, Ser: 0.95 mg/dL (ref 0.61–1.24)
GFR, Estimated: >60 mL/min (ref >60)
Glucose, Bld: 125 mg/dL — ABNORMAL HIGH (ref 70–99)
Potassium: 3.7 mmol/L (ref 3.5–5.1)
Sodium: 138 mmol/L (ref 135–145)
Total Bilirubin: 0.6 mg/dL (ref 0.3–1.2)
Total Protein: 6.3 g/dL — ABNORMAL LOW (ref 6.5–8.1)

## 2023-03-06 LAB — CBC WITH DIFFERENTIAL/PLATELET
Abs Immature Granulocytes: 0.03 10*3/uL (ref 0.00–0.07)
Basophils Absolute: 0 10*3/uL (ref 0.0–0.1)
Basophils Relative: 1 %
Eosinophils Absolute: 0.1 10*3/uL (ref 0.0–0.5)
Eosinophils Relative: 1 %
HCT: 42.9 % (ref 39.0–52.0)
Hemoglobin: 15.2 g/dL (ref 13.0–17.0)
Immature Granulocytes: 0 %
Lymphocytes Relative: 20 %
Lymphs Abs: 1.7 10*3/uL (ref 0.7–4.0)
MCH: 32 pg (ref 26.0–34.0)
MCHC: 35.4 g/dL (ref 30.0–36.0)
MCV: 90.3 fL (ref 80.0–100.0)
Monocytes Absolute: 0.8 10*3/uL (ref 0.1–1.0)
Monocytes Relative: 10 %
Neutro Abs: 6 10*3/uL (ref 1.7–7.7)
Neutrophils Relative %: 68 %
Platelets: 288 10*3/uL (ref 150–400)
RBC: 4.75 MIL/uL (ref 4.22–5.81)
RDW: 12.3 % (ref 11.5–15.5)
WBC: 8.8 10*3/uL (ref 4.0–10.5)
nRBC: 0 % (ref 0.0–0.2)

## 2023-03-06 MED ORDER — IOHEXOL 300 MG/ML  SOLN
100.0000 mL | Freq: Once | INTRAMUSCULAR | Status: AC | PRN
  Administered 2023-03-06: 100 mL via INTRAVENOUS

## 2023-03-06 MED ORDER — OXYCODONE-ACETAMINOPHEN 5-325 MG PO TABS
1.0000 | ORAL_TABLET | Freq: Once | ORAL | Status: AC
  Administered 2023-03-06: 1 via ORAL
  Filled 2023-03-06: qty 1

## 2023-03-06 MED ORDER — OXYCODONE-ACETAMINOPHEN 5-325 MG PO TABS: 1.0000 | ORAL_TABLET | Freq: Four times a day (QID) | ORAL | 0 refills | Status: AC | PRN

## 2023-03-06 NOTE — ED Provider Notes (Signed)
[facility] Provider Note   CSN: [id number] Arrival date & time: 03/06/23  1346     History  Chief Complaint  Patient presents with   Fall   Back Pain    Peter Cline is a 47 y.o. male. With past medical history of ADHD, depression who presents to the emergency department with fall.  States yesterday evening around 6:30p he was out with his friends in the yard. They were seeing who could climb the furthest up a fallen over tree. +EtOH. States he got about 12-76ft in the air and slipped, falling to the ground. He landed on his back/right side. Denies LOC. Not anticoagulated. He tried some ibuprofen before bed last night. Woke up with ongoing right hip pain, low back pain and pain to his sternum. Denies headache, abdominal pain, pain to extremities or neck.    Fall Associated symptoms include chest pain.  Back Pain Associated symptoms: chest pain        Home Medications Prior to Admission medications   Medication Sig Start Date End Date Taking? Authorizing Provider  oxyCODONE-acetaminophen (PERCOCET/ROXICET) 5-325 MG tablet Take 1 tablet by mouth every 6 (six) hours as needed for up to 3 days for severe pain. 03/06/23 03/09/23 Yes Mickie Hillier, PA-C  amphetamine-dextroamphetamine (ADDERALL XR) 30 MG 24 hr capsule Take 30 mg by mouth daily.    [provider]  amphetamine-dextroamphetamine (ADDERALL XR) 30 MG 24 hr capsule Take 1 capsule (30 mg total) by mouth in the morning. 01/25/23     amphetamine-dextroamphetamine (ADDERALL) 10 MG tablet Take 1 tablet (10 mg total) by mouth daily. 01/25/23     azelastine (ASTELIN) 0.1 % nasal spray Place 1 spray into each notril 2 (two) times daily. 01/25/23     buPROPion (WELLBUTRIN XL) 300 MG 24 hr tablet Take one tablet (300 mg dose) by mouth every morning. 11/15/22     buPROPion (WELLBUTRIN XL) 300 MG 24 hr tablet Take 1 tablet (300 mg total) by mouth in the morning. 01/25/23      cephALEXin (KEFLEX) 500 MG capsule Take 1 capsule (500 mg total) by mouth 4 (four) times daily. 02/09/17   Pisciotta, Elmyra Ricks, PA-C  citalopram (CELEXA) 10 MG tablet Take 10 mg by mouth daily.    [provider]  citalopram (CELEXA) 40 MG tablet Take one tablet (40 mg dose) by mouth daily. 11/15/22     clonazePAM (KLONOPIN) 0.5 MG tablet Take one tablet (0.5 mg dose) by mouth daily as needed for Anxiety. 01/25/23     ezetimibe (ZETIA) 10 MG tablet Take one tablet (10 mg dose) by mouth daily. 11/15/22     guaiFENesin (MUCINEX) 600 MG 12 hr tablet Take 600 mg by mouth daily as needed for cough or to loosen phlegm.    [provider]  ibuprofen (ADVIL,MOTRIN) 200 MG tablet Take 400 mg by mouth every 6 (six) hours as needed for headache or mild pain.    [provider]  meloxicam (MOBIC) 15 MG tablet Take 1 tablet (15 mg total) by mouth daily. 01/25/23     ondansetron (ZOFRAN ODT) 8 MG disintegrating tablet Take 1 tablet (8 mg total) by mouth every 8 (eight) hours as needed for nausea. 10/04/14   Harden Mo, MD  ondansetron (ZOFRAN) 8 MG tablet Take 1 tablet (8 mg total) by mouth every 8 (eight) hours as needed for nausea. 10/24/13   Ripley Fraise, MD  pravastatin (PRAVACHOL) 40 MG tablet Take one  tablet (40 mg dose) by mouth every evening. 11/15/22     tamsulosin (FLOMAX) 0.4 MG CAPS capsule Take 1 capsule (0.4 mg total) by mouth daily. 10/04/14   Harden Mo, MD  testosterone cypionate (DEPOTESTOSTERONE CYPIONATE) 200 MG/ML injection Inject 1 mL (200 mg dose) into the muscle every 14 (fourteen) days. 11/15/22     testosterone cypionate (DEPOTESTOSTERONE CYPIONATE) 200 MG/ML injection Inject 1 mL (200 mg dose) into the muscle every 14 (fourteen) days. 01/25/23         Allergies    Latex    Review of Systems   Review of Systems  Cardiovascular:  Positive for chest pain.  Musculoskeletal:  Positive for arthralgias and back pain.  All other systems reviewed and are  negative.   Physical Exam Updated Vital Signs BP 108/67 (BP Location: Right Arm)   Pulse 66   Temp 98.4 F (36.9 C) (Oral)   Resp 18   Ht 6' (1.829 m)   Wt 119.2 kg   SpO2 98%   BMI 35.64 kg/m  Physical Exam Vitals and nursing note reviewed.  Constitutional:      General: He is not in acute distress.    Appearance: Normal appearance. He is obese. He is not ill-appearing or toxic-appearing.  HENT:     Head: Normocephalic and atraumatic.     Mouth/Throat:     Mouth: Mucous membranes are moist.     Pharynx: Oropharynx is clear.  Eyes:     General: No scleral icterus.    Extraocular Movements: Extraocular movements intact.     Pupils: Pupils are equal, round, and reactive to light.  Cardiovascular:     Rate and Rhythm: Normal rate and regular rhythm.     Pulses: Normal pulses.          Radial pulses are 2+ on the right side and 2+ on the left side.     Heart sounds: No murmur heard. Pulmonary:     Effort: Pulmonary effort is normal.     Breath sounds: Normal breath sounds.  Chest:     Chest wall: Tenderness present. No deformity or crepitus.    Abdominal:     General: Abdomen is protuberant. Bowel sounds are normal. There is no distension.     Palpations: Abdomen is soft.     Tenderness: There is no abdominal tenderness.     Comments: Abdomen is soft, non tender, no abdominal bruising  Musculoskeletal:        General: Tenderness present. Normal range of motion.     Cervical back: Normal range of motion and neck supple. No tenderness.     Comments: Moves all extremities equally well with good strength. Mild tenderness to the right hip with ROM of the right leg. Pelvis is stable. No obvious deformities. No bruising.  No C, T spine TTP. Mild L spine TTP. No obvious step offs or deformities.    Skin:    General: Skin is warm and dry.     Capillary Refill: Capillary refill takes less than 2 seconds.     Findings: No bruising.  Neurological:     General: No focal  deficit present.     Mental Status: He is alert and oriented to person, place, and time. Mental status is at baseline.  Psychiatric:        Mood and Affect: Mood normal.        Behavior: Behavior normal.        Thought Content: Thought content normal.  Judgment: Judgment normal.     ED Results / Procedures / Treatments   Labs (all labs ordered are listed, but only abnormal results are displayed) Labs Reviewed  COMPREHENSIVE METABOLIC PANEL - Abnormal; Notable for the following components:      Result Value   Glucose, Bld 125 (*)    Total Protein 6.3 (*)    AST 14 (*)    All other components within normal limits  CBC WITH DIFFERENTIAL/PLATELET    EKG None  Radiology CT T-SPINE NO CHARGE  Result Date: 03/06/2023 CLINICAL DATA:  Fall, back pain, L5 compression fracture. EXAM: CT THORACIC SPINE WITHOUT CONTRAST TECHNIQUE: Multidetector CT images of the thoracic were obtained using the standard protocol without intravenous contrast. RADIATION DOSE REDUCTION: This exam was performed according to the departmental dose-optimization program which includes automated exposure control, adjustment of the mA and/or kV according to patient size and/or use of iterative reconstruction technique. COMPARISON:  CT chest 03/06/2023 FINDINGS: Alignment: No vertebral subluxation is observed. Vertebrae: No fracture or acute bony findings. Intervertebral spurring anterior to the vertebral body column at all levels between T8 and T12. Paraspinal and other soft tissues: Please see dedicated chest CT report. Disc levels: No significant bony impingement. Suspected left lateral recess disc osteophyte complex T8-9, but without overt foraminal impingement. IMPRESSION: 1. No acute thoracic spine findings. 2. Suspected left lateral recess disc osteophyte complex at T8-9, but without overt foraminal impingement. Electronically Signed   By: Van Clines M.D.   On: 03/06/2023 17:06   CT L-SPINE NO  CHARGE  Result Date: 03/06/2023 CLINICAL DATA:  L5 fracture, fall from a tree EXAM: CT LUMBAR SPINE WITHOUT CONTRAST TECHNIQUE: Multidetector CT imaging of the lumbar spine was performed without intravenous contrast administration. Multiplanar CT image reconstructions were also generated. RADIATION DOSE REDUCTION: This exam was performed according to the departmental dose-optimization program which includes automated exposure control, adjustment of the mA and/or kV according to patient size and/or use of iterative reconstruction technique. COMPARISON:  CT abdomen 03/06/2023 FINDINGS: Segmentation: The lowest lumbar type non-rib-bearing vertebra is labeled as L5. Alignment: No vertebral subluxation is observed. Vertebrae: Superior endplate compression fracture at L5 is accompanied by a coronally oriented anterior component and mild anterior superior endplate combination. Very subtle irregularity along the posterosuperior endplate favors minimal middle column involvement. No posterior column involvement observed. Equivocal 1 mm posterior bony retropulsion. Nondisplaced fracture of the right L2 transverse process, image 49 series 11. Paraspinal and other soft tissues: Mild paraspinal edema particularly at the level of the L5 compression fracture. Disc levels: Congenitally short pedicles and suspected degenerative disc disease causing mild bilateral foraminal stenosis at L4-5 and mild right foraminal stenosis at L3-4. There is likely mild central narrowing of the thecal sac at L3-4 and L4-5 as well. IMPRESSION: 1. Acute 40% superior endplate compression fracture at L5 with minimal middle column involvement. Equivocal 1 mm posterior bony retropulsion. 2. Nondisplaced fracture of the right L2 transverse process. 3. Congenitally short pedicles and suspected degenerative disc disease causing mild bilateral foraminal stenosis at L4-5 and mild right foraminal stenosis at L3-4. There is likely mild central narrowing of the  thecal sac at L3-4 and L4-5. Electronically Signed   By: Van Clines M.D.   On: 03/06/2023 17:00   CT CHEST ABDOMEN PELVIS W CONTRAST  Result Date: 03/06/2023 CLINICAL DATA:  [place] out of a tree yesterday from about 12 foot height, blunt polytrauma, back and RIGHT hip pain EXAM: CT CHEST, ABDOMEN, AND PELVIS WITH CONTRAST TECHNIQUE:  Multidetector CT imaging of the chest, abdomen and pelvis was performed following the standard protocol during bolus administration of intravenous contrast. RADIATION DOSE REDUCTION: This exam was performed according to the departmental dose-optimization program which includes automated exposure control, adjustment of the mA and/or kV according to patient size and/or use of iterative reconstruction technique. CONTRAST:  125mL OMNIPAQUE IOHEXOL 300 MG/ML SOLN IV. No oral contrast. COMPARISON:  CT abdomen and pelvis 10/24/2013 FINDINGS: CT CHEST FINDINGS Cardiovascular: Heart size normal. Vascular structures patent. Aorta normal caliber without dissection or surrounding hemorrhage. Central pulmonary arteries patent. No pericardial effusion. Mediastinum/Nodes: Esophagus unremarkable. Base of cervical region normal appearance. No thoracic adenopathy. Lungs/Pleura: Lungs clear. No pulmonary infiltrate, pleural effusion, or pneumothorax. Musculoskeletal: Multilevel degenerative disc disease changes lower thoracic spine. No thoracic fractures. CT ABDOMEN PELVIS FINDINGS Hepatobiliary: Gallbladder and liver normal appearance Pancreas: Normal appearance Spleen: Normal appearance Adrenals/Urinary Tract: Adrenal glands, kidneys, ureters, and bladder normal appearance Stomach/Bowel: Stomach and bowel loops normal appearance. Normal appendix. Vascular/Lymphatic: Vascular structures patent.  No adenopathy. Reproductive: Unremarkable prostate gland and seminal vesicles Other: No free air or free fluid. No hernia or inflammatory process. Musculoskeletal: Superior endplate compression fracture  of L5 with mild anterior height loss, acute, new. Minimal associated prevertebral soft tissue swelling. Question small posterior central disc protrusion L4-L5. IMPRESSION: Acute superior endplate compression fracture of L5 with mild anterior height loss. Question small posterior central disc protrusion L4-L5. No additional intrathoracic, intra-abdominal, or intrapelvic abnormalities. Electronically Signed   By: Lavonia Dana M.D.   On: 03/06/2023 16:24   CT Cervical Spine Wo Contrast  Result Date: 03/06/2023 CLINICAL DATA:  Neck trauma. EXAM: CT CERVICAL SPINE WITHOUT CONTRAST TECHNIQUE: Multidetector CT imaging of the cervical spine was performed without intravenous contrast. Multiplanar CT image reconstructions were also generated. RADIATION DOSE REDUCTION: This exam was performed according to the departmental dose-optimization program which includes automated exposure control, adjustment of the mA and/or kV according to patient size and/or use of iterative reconstruction technique. COMPARISON:  None Available. FINDINGS: Alignment: Normal. Skull base and vertebrae: No acute fracture. No primary bone lesion or focal pathologic process. Soft tissues and spinal canal: No prevertebral fluid or swelling. No visible canal hematoma. Disc levels: Disc space narrowing with marginal osteophytes at C5-6 and C6-7. Upper chest: Negative. IMPRESSION: Degenerative changes.  No acute traumatic abnormalities. Electronically Signed   By: Sammie Bench M.D.   On: 03/06/2023 16:19   CT Head Wo Contrast  Result Date: 03/06/2023 CLINICAL DATA:  Fall from tree EXAM: CT HEAD WITHOUT CONTRAST TECHNIQUE: Contiguous axial images were obtained from the base of the skull through the vertex without intravenous contrast. RADIATION DOSE REDUCTION: This exam was performed according to the departmental dose-optimization program which includes automated exposure control, adjustment of the mA and/or kV according to patient size and/or use  of iterative reconstruction technique. COMPARISON:  None Available. FINDINGS: Brain: No evidence of acute infarction, hemorrhage, hydrocephalus, extra-axial collection or mass lesion/mass effect. Vascular: No hyperdense vessel or unexpected calcification. Skull: Normal. Negative for fracture or focal lesion. Sinuses/Orbits: No acute finding. Other: None. IMPRESSION: No acute intracranial abnormality. Electronically Signed   By: Davina Poke D.O.   On: 03/06/2023 16:18   DG Hip Unilat With Pelvis 2-3 Views Right  Result Date: 03/06/2023 CLINICAL DATA:  Right hip pain, fall from a tree yesterday EXAM: DG HIP (WITH OR WITHOUT PELVIS) 2-3V RIGHT COMPARISON:  None FINDINGS: Minimal spurring of the right femoral head. No hip fracture is identified. IMPRESSION: 1. No acute  findings. 2. Minimal spurring of the right femoral head. Electronically Signed   By: Van Clines M.D.   On: 03/06/2023 15:45    Procedures Procedures   Medications Ordered in ED Medications  oxyCODONE-acetaminophen (PERCOCET/ROXICET) 5-325 MG per tablet 1 tablet (has no administration in time range)  iohexol (OMNIPAQUE) 300 MG/ML solution 100 mL (100 mLs Intravenous Contrast Given 03/06/23 1513)  oxyCODONE-acetaminophen (PERCOCET/ROXICET) 5-325 MG per tablet 1 tablet (1 tablet Oral Given 03/06/23 1542)    ED Course/ Medical Decision Making/ A&P    Medical Decision Making Amount and/or Complexity of Data Reviewed Labs: ordered. Radiology: ordered.  Risk Prescription drug management.  Initial Impression and Ddx 47 year old male who presents after fall last night from height Patient PMH that increases complexity of ED encounter:  ADHD Differential: intracranial bleed, spinal injury, extremity injury, intrathoracic or abdominal blunt injuries  Interpretation of Diagnostics I independent reviewed and interpreted the labs as followed: Labs unremarkable  - I independently visualized the following imaging with scope of  interpretation limited to determining acute life threatening conditions related to emergency care: Trauma scans, which revealed acute L5 fracture also small L2 fracture  Patient Reassessment and Ultimate Disposition/Management Overall well appearing and stable. Vitals are good. He is alert and oriented. He has TTP of the sternum, L spine, right hip. Will trauma scan him given fall from 11ft. Declining pain meds on initial evaluation.   Labs without any acute findings.  Obtain trauma scans.  The CT head, C-spine, chest abdomen pelvis are without abnormalities.  He does have a L5 fracture as well as an L2 fracture.  I consulted and spoke with neurosurgery and spoke with Meyran, PA-C with Dr. Ronnald Ramp, [place] [facility].  She recommends LSO brace and follow-up in clinic this week.  LSO brace has been ordered.  Additionally I discussed precautions like no lifting greater than 5 pounds, bending at the knees, getting in and out of bed.  I will prescribe him a short course of opioid pain medication in the acute setting.  He is also instructed to start using NSAIDs.  Discussed not laying down for long periods of time as this will worsen pain.  He has no cauda equina symptoms but we discussed return precautions for weakness or numbness or tingling in his legs, saddle anesthesia or incontinence or retention of bowel or bladder.  Otherwise feel that he is appropriate for discharge at this time.  The patient has been appropriately medically screened and/or stabilized in the ED. I have low suspicion for any other emergent medical condition which would require further screening, evaluation or treatment in the ED or require inpatient management. At time of discharge the patient is hemodynamically stable and in no acute distress. I have discussed work-up results and diagnosis with patient and answered all questions. Patient is agreeable with discharge plan. We discussed strict return precautions for returning to the  emergency department and they verbalized understanding.      Patient management required discussion with the following services or consulting groups:  Neurosurgery  Complexity of Problems Addressed Acute complicated illness or Injury  Additional Data Reviewed and Analyzed Further history obtained from: Further history from spouse/family member, Past medical history and medications listed in the EMR, and Care Everywhere  Patient Encounter Risk Assessment Prescriptions, SDOH impact on management, and Use of parenteral controlled substances  Final Clinical Impression(s) / ED Diagnoses Final diagnoses:  Closed fracture of second lumbar vertebra, unspecified fracture morphology, initial encounter ([place])  Closed fracture of fifth lumbar  vertebra, unspecified fracture morphology, initial encounter [facility])    Rx / DC Orders ED Discharge Orders          Ordered    Ambulatory referral to Neurosurgery        03/06/23 1827    oxyCODONE-acetaminophen (PERCOCET/ROXICET) 5-325 MG tablet  Every 6 hours PRN        03/06/23 1829              Mickie Hillier, PA-C 03/06/23 1852    Ezequiel Essex, MD 03/07/23 [phone]

## 2023-03-06 NOTE — ED Notes (Signed)
Representative returned call and will be here in 45 minutes

## 2023-03-06 NOTE — ED Notes (Signed)
TLSO Rep at bedside for application of brace.

## 2023-03-06 NOTE — Discharge Instructions (Addendum)
You were seen in the department today for fracture after falling out of a tree.  You are to follow-up with [place] [facility] this week.  If they do not call you I have given you their information to call next 2 days to schedule follow-up appointment.  In the meantime we have placed you in a protective back brace for you to wear at all times except sleep.  I have prescribed you a short course of pain medication to help with the acute pain.  You have been prescribed a medication that is considered an opiate. Opiates are pain medications that should be used with caution. It is important that you do not drive while taking this medication as it can cause drowsiness and impaired reaction times. Do not mix this medication with benzodiazepine medications or alcohol as this can cause respiratory depression. Additionally, opiates have addicting properties to them. Please use medication as prescribed by your provider.  Please return to the emergency department if you have numbness or weakness in your lower extremities, groin or inability to control your bowel or bladder.

## 2023-03-06 NOTE — Progress Notes (Signed)
CT lumbar spine reviewed which shows a compression fracture of L5 with little posterior retropulsion. No surgical intervention needed at this time. Recommend LSO brace to wear when out of bed and follow up with [place] in the office.

## 2023-03-06 NOTE — ED Notes (Signed)
2nd call placed to [facility] regarding TLSO brace to notify representative has not returned call.

## 2023-03-06 NOTE — ED Triage Notes (Signed)
Patient arrives ambulatory to ED with complaints of back pain and right hip pain due to a fall. Patient fell out of a tree (~12 foot high) yesterday.   Rates pain an 8/10.

## 2023-03-06 NOTE — ED Notes (Signed)
[facility] called a [phone] to order TLSO brace, representative to return call to charge phone.

## 2023-03-06 NOTE — ED Notes (Signed)
Ortho Tech at bedside.  

## 2023-03-09 ENCOUNTER — Other Ambulatory Visit (HOSPITAL_BASED_OUTPATIENT_CLINIC_OR_DEPARTMENT_OTHER): Payer: Self-pay

## 2023-03-10 ENCOUNTER — Other Ambulatory Visit (HOSPITAL_BASED_OUTPATIENT_CLINIC_OR_DEPARTMENT_OTHER): Payer: Self-pay

## 2023-03-11 ENCOUNTER — Other Ambulatory Visit (HOSPITAL_BASED_OUTPATIENT_CLINIC_OR_DEPARTMENT_OTHER): Payer: Self-pay

## 2023-03-11 MED ORDER — CLONAZEPAM 0.5 MG PO TABS
ORAL_TABLET | ORAL | 0 refills | Status: DC
  Filled 2023-03-11: qty 30, 30d supply, fill #0

## 2023-03-11 MED ORDER — MELOXICAM 15 MG PO TABS
15.0000 mg | ORAL_TABLET | Freq: Every day | ORAL | 0 refills | Status: DC
  Filled 2023-03-11: qty 30, 30d supply, fill #0

## 2023-03-11 MED ORDER — AMPHETAMINE-DEXTROAMPHETAMINE 10 MG PO TABS
10.0000 mg | ORAL_TABLET | Freq: Every day | ORAL | 0 refills | Status: DC
  Filled 2023-03-11: qty 30, 30d supply, fill #0

## 2023-03-11 MED ORDER — AMPHETAMINE-DEXTROAMPHET ER 30 MG PO CP24
ORAL_CAPSULE | ORAL | 0 refills | Status: DC
  Filled 2023-03-11: qty 30, 30d supply, fill #0

## 2023-03-12 ENCOUNTER — Other Ambulatory Visit (HOSPITAL_BASED_OUTPATIENT_CLINIC_OR_DEPARTMENT_OTHER): Payer: Self-pay

## 2023-03-13 ENCOUNTER — Other Ambulatory Visit (HOSPITAL_BASED_OUTPATIENT_CLINIC_OR_DEPARTMENT_OTHER): Payer: Self-pay

## 2023-03-14 ENCOUNTER — Other Ambulatory Visit: Payer: Self-pay

## 2023-03-14 ENCOUNTER — Other Ambulatory Visit (HOSPITAL_BASED_OUTPATIENT_CLINIC_OR_DEPARTMENT_OTHER): Payer: Self-pay

## 2023-06-09 ENCOUNTER — Other Ambulatory Visit (HOSPITAL_BASED_OUTPATIENT_CLINIC_OR_DEPARTMENT_OTHER): Payer: Self-pay

## 2023-06-09 ENCOUNTER — Other Ambulatory Visit: Payer: Self-pay

## 2023-06-09 MED ORDER — MELOXICAM 15 MG PO TABS
15.0000 mg | ORAL_TABLET | Freq: Every day | ORAL | 0 refills | Status: DC
  Filled 2023-06-09: qty 30, 30d supply, fill #0

## 2023-06-09 MED ORDER — CITALOPRAM HYDROBROMIDE 40 MG PO TABS
40.0000 mg | ORAL_TABLET | Freq: Every day | ORAL | 0 refills | Status: DC
  Filled 2023-06-09: qty 30, 30d supply, fill #0

## 2023-06-09 MED ORDER — TESTOSTERONE CYPIONATE 200 MG/ML IM SOLN
200.0000 mg | INTRAMUSCULAR | 0 refills | Status: DC
  Filled 2023-06-09: qty 2, 28d supply, fill #0

## 2023-06-09 MED ORDER — AMPHETAMINE-DEXTROAMPHET ER 30 MG PO CP24
30.0000 mg | ORAL_CAPSULE | Freq: Every morning | ORAL | 0 refills | Status: DC
  Filled 2023-06-09: qty 30, 30d supply, fill #0

## 2023-06-09 MED ORDER — CLONAZEPAM 0.5 MG PO TABS
0.5000 mg | ORAL_TABLET | Freq: Every day | ORAL | 0 refills | Status: DC | PRN
  Filled 2023-06-09: qty 30, 30d supply, fill #0

## 2023-06-09 MED ORDER — BUPROPION HCL ER (XL) 300 MG PO TB24
300.0000 mg | ORAL_TABLET | Freq: Every morning | ORAL | 0 refills | Status: DC
  Filled 2023-06-09: qty 30, 30d supply, fill #0

## 2023-06-09 MED ORDER — AMPHETAMINE-DEXTROAMPHETAMINE 10 MG PO TABS
10.0000 mg | ORAL_TABLET | Freq: Every day | ORAL | 0 refills | Status: DC
  Filled 2023-06-09: qty 30, 30d supply, fill #0

## 2023-07-26 ENCOUNTER — Other Ambulatory Visit (HOSPITAL_BASED_OUTPATIENT_CLINIC_OR_DEPARTMENT_OTHER): Payer: Self-pay

## 2023-07-26 MED ORDER — BUPROPION HCL ER (XL) 300 MG PO TB24
300.0000 mg | ORAL_TABLET | Freq: Every morning | ORAL | 0 refills | Status: DC
  Filled 2023-07-26: qty 30, 30d supply, fill #0

## 2023-07-26 MED ORDER — CITALOPRAM HYDROBROMIDE 40 MG PO TABS
40.0000 mg | ORAL_TABLET | Freq: Every day | ORAL | 0 refills | Status: DC
  Filled 2023-07-26: qty 30, 30d supply, fill #0

## 2023-07-26 MED ORDER — MELOXICAM 15 MG PO TABS
15.0000 mg | ORAL_TABLET | Freq: Every day | ORAL | 0 refills | Status: DC
  Filled 2023-07-26: qty 30, 30d supply, fill #0

## 2023-07-29 ENCOUNTER — Other Ambulatory Visit (HOSPITAL_BASED_OUTPATIENT_CLINIC_OR_DEPARTMENT_OTHER): Payer: Self-pay

## 2023-07-29 MED ORDER — VITAMIN D (ERGOCALCIFEROL) 1.25 MG (50000 UNIT) PO CAPS
50000.0000 [IU] | ORAL_CAPSULE | ORAL | 0 refills | Status: DC
  Filled 2023-07-29: qty 4, 28d supply, fill #0

## 2023-08-08 ENCOUNTER — Other Ambulatory Visit (HOSPITAL_BASED_OUTPATIENT_CLINIC_OR_DEPARTMENT_OTHER): Payer: Self-pay

## 2023-09-14 ENCOUNTER — Other Ambulatory Visit: Payer: Self-pay

## 2023-09-14 ENCOUNTER — Other Ambulatory Visit (HOSPITAL_BASED_OUTPATIENT_CLINIC_OR_DEPARTMENT_OTHER): Payer: Self-pay

## 2023-09-14 MED ORDER — BUPROPION HCL ER (XL) 300 MG PO TB24
300.0000 mg | ORAL_TABLET | Freq: Every morning | ORAL | 1 refills | Status: DC
  Filled 2023-09-14: qty 30, 30d supply, fill #0

## 2023-09-14 MED ORDER — CLONAZEPAM 0.5 MG PO TABS
0.5000 mg | ORAL_TABLET | Freq: Every day | ORAL | 0 refills | Status: DC | PRN
  Filled 2023-09-14: qty 30, 30d supply, fill #0

## 2023-09-14 MED ORDER — TESTOSTERONE CYPIONATE 200 MG/ML IM SOLN
200.0000 mg | INTRAMUSCULAR | 0 refills | Status: DC
  Filled 2023-09-14: qty 2, 28d supply, fill #0

## 2023-09-14 MED ORDER — AMPHETAMINE-DEXTROAMPHET ER 30 MG PO CP24
30.0000 mg | ORAL_CAPSULE | Freq: Every morning | ORAL | 0 refills | Status: DC
  Filled 2023-09-14: qty 30, 30d supply, fill #0

## 2023-09-14 MED ORDER — AMPHETAMINE-DEXTROAMPHETAMINE 10 MG PO TABS
10.0000 mg | ORAL_TABLET | Freq: Every day | ORAL | 0 refills | Status: DC
  Filled 2023-09-14: qty 30, 30d supply, fill #0

## 2023-09-14 MED ORDER — CITALOPRAM HYDROBROMIDE 40 MG PO TABS
40.0000 mg | ORAL_TABLET | Freq: Every day | ORAL | 1 refills | Status: DC
  Filled 2023-09-14: qty 30, 30d supply, fill #0

## 2023-09-14 MED ORDER — PRAVASTATIN SODIUM 40 MG PO TABS
40.0000 mg | ORAL_TABLET | Freq: Every evening | ORAL | 1 refills | Status: AC
  Filled 2023-09-14: qty 30, 30d supply, fill #0
  Filled 2024-05-21: qty 30, 30d supply, fill #1
  Filled 2024-06-29: qty 30, 30d supply, fill #2
  Filled 2024-07-31: qty 30, 30d supply, fill #3
  Filled 2024-08-31: qty 30, 30d supply, fill #4

## 2023-09-14 MED ORDER — MELOXICAM 15 MG PO TABS
15.0000 mg | ORAL_TABLET | Freq: Every day | ORAL | 0 refills | Status: DC
  Filled 2023-09-14: qty 30, 30d supply, fill #0

## 2023-09-14 MED ORDER — EZETIMIBE 10 MG PO TABS
10.0000 mg | ORAL_TABLET | Freq: Every day | ORAL | 0 refills | Status: DC
  Filled 2023-09-14: qty 30, 30d supply, fill #0

## 2023-09-29 ENCOUNTER — Other Ambulatory Visit (HOSPITAL_BASED_OUTPATIENT_CLINIC_OR_DEPARTMENT_OTHER): Payer: Self-pay

## 2023-09-29 MED ORDER — NA SULFATE-K SULFATE-MG SULF 17.5-3.13-1.6 GM/177ML PO SOLN
ORAL | 0 refills | Status: AC
  Filled 2023-09-29: qty 354, 1d supply, fill #0

## 2023-10-25 ENCOUNTER — Other Ambulatory Visit (HOSPITAL_BASED_OUTPATIENT_CLINIC_OR_DEPARTMENT_OTHER): Payer: Self-pay

## 2023-10-25 ENCOUNTER — Ambulatory Visit (INDEPENDENT_AMBULATORY_CARE_PROVIDER_SITE_OTHER): Payer: 59 | Admitting: Primary Care

## 2023-10-25 ENCOUNTER — Encounter: Payer: Self-pay | Admitting: Primary Care

## 2023-10-25 VITALS — BP 120/84 | HR 91 | Ht 72.0 in | Wt 269.8 lb

## 2023-10-25 DIAGNOSIS — R0681 Apnea, not elsewhere classified: Secondary | ICD-10-CM

## 2023-10-25 MED ORDER — AMPHETAMINE-DEXTROAMPHETAMINE 10 MG PO TABS
10.0000 mg | ORAL_TABLET | Freq: Every day | ORAL | 0 refills | Status: DC
  Filled 2023-10-25: qty 30, 30d supply, fill #0

## 2023-10-25 MED ORDER — BUPROPION HCL ER (XL) 300 MG PO TB24
300.0000 mg | ORAL_TABLET | Freq: Every morning | ORAL | 1 refills | Status: DC
  Filled 2023-10-25: qty 30, 30d supply, fill #0

## 2023-10-25 MED ORDER — CLONAZEPAM 0.5 MG PO TABS
0.5000 mg | ORAL_TABLET | Freq: Every day | ORAL | 0 refills | Status: DC | PRN
  Filled 2023-10-25: qty 30, 30d supply, fill #0

## 2023-10-25 MED ORDER — EZETIMIBE 10 MG PO TABS
10.0000 mg | ORAL_TABLET | Freq: Every day | ORAL | 0 refills | Status: DC
  Filled 2023-10-25: qty 30, 30d supply, fill #0
  Filled 2024-05-21: qty 30, 30d supply, fill #1
  Filled 2024-06-29: qty 30, 30d supply, fill #2

## 2023-10-25 MED ORDER — TESTOSTERONE CYPIONATE 200 MG/ML IM SOLN
200.0000 mg | INTRAMUSCULAR | 0 refills | Status: DC
  Filled 2023-10-25: qty 2, 28d supply, fill #0

## 2023-10-25 MED ORDER — AMPHETAMINE-DEXTROAMPHET ER 30 MG PO CP24
30.0000 mg | ORAL_CAPSULE | Freq: Every morning | ORAL | 0 refills | Status: DC
  Filled 2023-10-25: qty 30, 30d supply, fill #0

## 2023-10-25 MED ORDER — MELOXICAM 15 MG PO TABS
15.0000 mg | ORAL_TABLET | Freq: Every day | ORAL | 0 refills | Status: DC
  Filled 2023-10-25: qty 30, 30d supply, fill #0

## 2023-10-25 NOTE — Patient Instructions (Addendum)
Sleep apnea is defined as period of 10 seconds or longer when you stop breathing at night. This can happen multiple times a night. Dx sleep apnea is when this occurs more than 5 times an hour.    Mild OSA 5-15 apneic events an hour Moderate OSA 15-30 apneic events an hour Severe OSA > 30 apneic events an hour   Untreated sleep apnea puts you at higher risk for cardiac arrhythmias, pulmonary HTN, stroke and diabetes  Treatment options include weight loss, side sleeping position, oral appliance, CPAP therapy or referral to ENT for possible surgical options    Recommendations: Focus on side sleeping position or elevate head with wedge pillow 30 degrees Work on weight loss efforts if able  Do not drive if experiencing excessive daytime sleepiness of fatigue    Orders: Home sleep study re: loud snoring    Follow-up: Please call to schedule follow-up 1-2 weeks after completing home sleep study to review results and treatment if needed (can be virtual)  Sleep Apnea Sleep apnea affects breathing during sleep. It causes breathing to stop for 10 seconds or more, or to become shallow. People with sleep apnea usually snore loudly. It can also increase the risk of: Heart attack. Stroke. Being very overweight (obese). Diabetes. Heart failure. Irregular heartbeat. High blood pressure. The goal of treatment is to help you breathe normally again. What are the causes?  The most common cause of this condition is a collapsed or blocked airway. There are three kinds of sleep apnea: Obstructive sleep apnea. This is caused by a blocked or collapsed airway. Central sleep apnea. This happens when the brain does not send the right signals to the muscles that control breathing. Mixed sleep apnea. This is a combination of obstructive and central sleep apnea. What increases the risk? Being overweight. Smoking. Having a small airway. Being older. Being male. Drinking alcohol. Taking medicines to  calm yourself (sedatives or tranquilizers). Having family members with the condition. Having a tongue or tonsils that are larger than normal. What are the signs or symptoms? Trouble staying asleep. Loud snoring. Headaches in the morning. Waking up gasping. Dry mouth or sore throat in the morning. Being sleepy or tired during the day. If you are sleepy or tired during the day, you may also: Not be able to focus your mind (concentrate). Forget things. Get angry a lot and have mood swings. Feel sad (depressed). Have changes in your personality. Have less interest in sex, if you are male. Be unable to have an erection, if you are male. How is this treated?  Sleeping on your side. Using a medicine to get rid of mucus in your nose (decongestant). Avoiding the use of alcohol, medicines to help you relax, or certain pain medicines (narcotics). Losing weight, if needed. Changing your diet. Quitting smoking. Using a machine to open your airway while you sleep, such as: An oral appliance. This is a mouthpiece that shifts your lower jaw forward. A CPAP device. This device blows air through a mask when you breathe out (exhale). An EPAP device. This has valves that you put in each nostril. A BIPAP device. This device blows air through a mask when you breathe in (inhale) and breathe out. Having surgery if other treatments do not work. Follow these instructions at home: Lifestyle Make changes that your doctor recommends. Eat a healthy diet. Lose weight if needed. Avoid alcohol, medicines to help you relax, and some pain medicines. Do not smoke or use any products that contain   nicotine or tobacco. If you need help quitting, ask your doctor. General instructions Take over-the-counter and prescription medicines only as told by your doctor. If you were given a machine to use while you sleep, use it only as told by your doctor. If you are having surgery, make sure to tell your doctor you have  sleep apnea. You may need to bring your device with you. Keep all follow-up visits. Contact a doctor if: The machine that you were given to use during sleep bothers you or does not seem to be working. You do not get better. You get worse. Get help right away if: Your chest hurts. You have trouble breathing in enough air. You have an uncomfortable feeling in your back, arms, or stomach. You have trouble talking. One side of your body feels weak. A part of your face is hanging down. These symptoms may be an emergency. Get help right away. Call your local emergency services (911 in the [place] Do not wait to see if the symptoms will go away. Do not drive yourself to the hospital. Summary This condition affects breathing during sleep. The most common cause is a collapsed or blocked airway. The goal of treatment is to help you breathe normally while you sleep. This information is not intended to replace advice given to you by your health care provider. Make sure you discuss any questions you have with your health care provider. Document Revised: 07/08/2021 Document Reviewed: 11/07/2020 Elsevier Patient Education  2024 [employer]  

## 2023-10-25 NOTE — Progress Notes (Signed)
@Patient  ID: Peter Cline, male    DOB: [birth year], 47 y.o.   MRN: [id number]  Chief Complaint  Patient presents with   Consult    Referring provider: Fransisca Kaufmann*  HPI: 47 year old male, never smoked. PMH significant for  ADD, depression, hyperlipidemia, testosterone deficiency, hyperlipidemia.  10/25/2023 Discussed the use of AI scribe software for clinical note transcription with the patient, who gave verbal consent to proceed.  History of Present Illness   The patient presents for a sleep consultation due to concerns of frequent awakenings, non-restorative sleep, and snoring. He reports that his spouse has observed episodes of apnea during sleep. These symptoms have been ongoing for a couple of years, but the patient notes a worsening in the past year, particularly feeling tired upon waking and throughout the day. He has occasionally woken up gasping or choking, but this is rare. Despite the snoring, the patient primarily sleeps on his side.  Over the past couple of years, the patient has gained approximately twenty pounds. He reports falling asleep quickly but wakes up five to six times per night due to restlessness and tossing and turning. A home sleep study was conducted a few years ago when the symptoms began, but the results were inconclusive.  The patient also reports occasional sleep talking and a single episode of screaming during sleep about a year ago similar to a night terror. No other significant sleep behaviors. He denies symptoms of narcolepsy or sudden sleep attacks during the day. His father has sleep apnea and wears a CPAP.      Sleep questionnaire Symptoms-   wakes up frequently, non-restorative sleep, snoring  Prior sleep study- yes, home study was inconclusive several years back  Bedtime-10-11pm Time to fall asleep- 15 mins Nocturnal awakenings- 5-6 times  Out of bed/start of day- 5:30am Weight changes- 20 lbs Do you operate heavy machinery-  yes Do you currently wear CPAP- no Do you current wear oxygen- no [facility]- 14  Allergies  Allergen Reactions   Latex     Hands break out    Immunization History  Administered Date(s) Administered   PFIZER(Purple Top)SARS-COV-2 Vaccination 02/14/2020, 03/13/2020, 10/13/2020   Pfizer Covid-19 Vaccine Bivalent Booster 52yrs & up 09/01/2021    No past medical history on file.  Tobacco History: Social History   Tobacco Use  Smoking Status Never  Smokeless Tobacco Never   Counseling given: Not Answered   Outpatient Medications Prior to Visit  Medication Sig Dispense Refill   amphetamine-dextroamphetamine (ADDERALL XR) 30 MG 24 hr capsule Take 1 capsule (30 mg total) by mouth in the morning. 30 capsule 0   amphetamine-dextroamphetamine (ADDERALL) 10 MG tablet Take 1 tablet (10 mg total) by mouth daily. 30 tablet 0   azelastine (ASTELIN) 0.1 % nasal spray Place 1 spray into each notril 2 (two) times daily. 30 mL 12   buPROPion (WELLBUTRIN XL) 300 MG 24 hr tablet Take 1 tablet (300 mg total) by mouth every morning. 90 tablet 1   citalopram (CELEXA) 40 MG tablet Take 1 tablet (40 mg total) by mouth daily. 90 tablet 1   clonazePAM (KLONOPIN) 0.5 MG tablet Take one tablet (0.5 mg dose) by mouth daily as needed for Anxiety. 30 tablet 0   clonazePAM (KLONOPIN) 0.5 MG tablet Take 1 tablet (0.5 mg total) by mouth daily as needed as needed. 30 tablet 0   clonazePAM (KLONOPIN) 0.5 MG tablet Take 1 tablet (0.5 mg total) by mouth daily as needed for Anxiety.  30 tablet 0   ezetimibe (ZETIA) 10 MG tablet Take 1 tablet (10 mg total) by mouth daily. 90 tablet 0   guaiFENesin (MUCINEX) 600 MG 12 hr tablet Take 600 mg by mouth daily as needed for cough or to loosen phlegm.     ibuprofen (ADVIL,MOTRIN) 200 MG tablet Take 400 mg by mouth every 6 (six) hours as needed for headache or mild pain.     meloxicam (MOBIC) 15 MG tablet Take 1 tablet (15 mg total) by mouth daily. 30 tablet 0   Na Sulfate-K  Sulfate-Mg Sulf 17.5-3.13-1.6 GM/177ML SOLN TAKE 177 MLS BY MOUTH 2 TIMES. 354 mL 0   pravastatin (PRAVACHOL) 40 MG tablet Take 1 tablet (40 mg total) by mouth every evening. 90 tablet 1   testosterone cypionate (DEPOTESTOSTERONE CYPIONATE) 200 MG/ML injection Inject 1 mL (200 mg total) into the muscle every 14 (fourteen) days. 10 mL 0   amphetamine-dextroamphetamine (ADDERALL XR) 30 MG 24 hr capsule Take 30 mg by mouth daily.     amphetamine-dextroamphetamine (ADDERALL XR) 30 MG 24 hr capsule Take 1 capsule (30 mg total) by mouth in the morning. 30 capsule 0   amphetamine-dextroamphetamine (ADDERALL) 10 MG tablet Take 1 tablet (10 mg total) by mouth daily. 30 tablet 0   buPROPion (WELLBUTRIN XL) 300 MG 24 hr tablet Take one tablet (300 mg dose) by mouth every morning. 90 tablet 0   buPROPion (WELLBUTRIN XL) 300 MG 24 hr tablet Take 1 tablet (300 mg total) by mouth in the morning. 90 tablet 0   buPROPion (WELLBUTRIN XL) 300 MG 24 hr tablet Take 1 tablet (300 mg total) by mouth in the morning. 90 tablet 0   buPROPion (WELLBUTRIN XL) 300 MG 24 hr tablet Take 1 tablet (300 mg total) by mouth in the morning. 30 tablet 0   buPROPion (WELLBUTRIN XL) 300 MG 24 hr tablet Take 1 tablet (300 mg total) by mouth in the morning. 90 tablet 1   cephALEXin (KEFLEX) 500 MG capsule Take 1 capsule (500 mg total) by mouth 4 (four) times daily. 20 capsule 0   citalopram (CELEXA) 10 MG tablet Take 10 mg by mouth daily.     citalopram (CELEXA) 40 MG tablet Take one tablet (40 mg dose) by mouth daily. 90 tablet 0   citalopram (CELEXA) 40 MG tablet Take 1 tablet (40 mg total) by mouth daily. 90 tablet 0   citalopram (CELEXA) 40 MG tablet Take 1 tablet (40 mg total) by mouth daily. 30 tablet 0   clonazePAM (KLONOPIN) 0.5 MG tablet Take 1 tablet (0.5 mg total) by mouth daily as needed for anxiety 30 tablet 0   ezetimibe (ZETIA) 10 MG tablet Take one tablet (10 mg dose) by mouth daily. 90 tablet 0   ezetimibe (ZETIA) 10 MG  tablet Take 1 tablet (10 mg total) by mouth daily. 90 tablet 0   meloxicam (MOBIC) 15 MG tablet Take 1 tablet (15 mg total) by mouth daily. 30 tablet 0   meloxicam (MOBIC) 15 MG tablet Take 1 tablet (15 mg total) by mouth daily. 30 tablet 0   ondansetron (ZOFRAN ODT) 8 MG disintegrating tablet Take 1 tablet (8 mg total) by mouth every 8 (eight) hours as needed for nausea. 12 tablet 0   ondansetron (ZOFRAN) 8 MG tablet Take 1 tablet (8 mg total) by mouth every 8 (eight) hours as needed for nausea. 4 tablet 0   pravastatin (PRAVACHOL) 40 MG tablet Take one tablet (40 mg dose) by mouth every  evening. 90 tablet 0   tamsulosin (FLOMAX) 0.4 MG CAPS capsule Take 1 capsule (0.4 mg total) by mouth daily. 7 capsule 0   testosterone cypionate (DEPOTESTOSTERONE CYPIONATE) 200 MG/ML injection Inject 1 mL (200 mg dose) into the muscle every 14 (fourteen) days. 10 mL 0   testosterone cypionate (DEPOTESTOSTERONE CYPIONATE) 200 MG/ML injection Inject 1 mL (200 mg dose) into the muscle every 14 (fourteen) days. 10 mL 0   testosterone cypionate (DEPOTESTOSTERONE CYPIONATE) 200 MG/ML injection Inject 1 mL (200 mg total) into the muscle every 14 (fourteen) days. 10 mL 0   testosterone cypionate (DEPOTESTOSTERONE CYPIONATE) 200 MG/ML injection Inject 1 mL (200 mg total) into the muscle every 14 (fourteen) days. 10 mL 0   Vitamin D, Ergocalciferol, (DRISDOL) 1.25 MG (50000 UNIT) CAPS capsule Take 1 capsule (50,000 Units total) by mouth once a week at 9 AM. 12 capsule 0   No facility-administered medications prior to visit.   Review of Systems  Review of Systems  Constitutional: Negative.   Respiratory:  Positive for apnea. Negative for cough, shortness of breath and wheezing.        Exertional dyspnea  Cardiovascular: Negative.   Psychiatric/Behavioral:  Positive for sleep disturbance.      Physical Exam  BP 120/84 (BP Location: Left Arm, Cuff Size: Large)   Pulse 91   Ht 6' (1.829 m)   Wt 269 lb 12.8 oz  (122.4 kg)   SpO2 96%   BMI 36.59 kg/m  Physical Exam Constitutional:      General: He is not in acute distress.    Appearance: Normal appearance. He is not ill-appearing.  HENT:     Head: Normocephalic and atraumatic.     Mouth/Throat:     Mouth: Mucous membranes are moist.     Pharynx: Oropharynx is clear.  Cardiovascular:     Rate and Rhythm: Normal rate and regular rhythm.  Pulmonary:     Effort: Pulmonary effort is normal.     Breath sounds: Normal breath sounds. No wheezing, rhonchi or rales.  Skin:    General: Skin is warm and dry.  Neurological:     General: No focal deficit present.     Mental Status: He is alert and oriented to person, place, and time. Mental status is at baseline.  Psychiatric:        Mood and Affect: Mood normal.        Behavior: Behavior normal.        Thought Content: Thought content normal.        Judgment: Judgment normal.      Lab Results:  CBC    Component Value Date/Time   WBC 8.8 03/06/2023 1422   RBC 4.75 03/06/2023 1422   HGB 15.2 03/06/2023 1422   HCT 42.9 03/06/2023 1422   PLT 288 03/06/2023 1422   MCV 90.3 03/06/2023 1422   MCH 32.0 03/06/2023 1422   MCHC 35.4 03/06/2023 1422   RDW 12.3 03/06/2023 1422   LYMPHSABS 1.7 03/06/2023 1422   MONOABS 0.8 03/06/2023 1422   EOSABS 0.1 03/06/2023 1422   BASOSABS 0.0 03/06/2023 1422    BMET    Component Value Date/Time   NA 138 03/06/2023 1422   K 3.7 03/06/2023 1422   CL 104 03/06/2023 1422   CO2 26 03/06/2023 1422   GLUCOSE 125 (H) 03/06/2023 1422   BUN 11 03/06/2023 1422   CREATININE 0.95 03/06/2023 1422   CALCIUM 9.2 03/06/2023 1422   GFRNONAA >60 03/06/2023 1422  GFRAA >90 10/24/2013 1835    BNP No results found for: "BNP"  ProBNP No results found for: "PROBNP"  Imaging: No results found.   Assessment & Plan:   1. Witnessed episode of apnea - Home sleep test; Future   Suspected Obstructive Sleep Apnea Reports of snoring, witnessed apneas,  unrefreshing sleep, and nocturnal restlessness. Previous home sleep study was inconclusive. Recent weight gain. No history of cardiopulmonary disease, seizures, or narcolepsy. -Reviewed risks of untreated sleep apnea including cardiac arrhythmias, pulmonary hypertension, diabetes and stroke.  We also discussed treatment options including weight loss, oral appliance, CPAP therapy referral to ENT for possible surgical options -Ordered for a home sleep study with [facility]. -Encouraged weight loss and side sleeping position.  Advised patient against driving if experiencing excessive daytime sleepiness or fatigue. -Follow-up in 1-2 weeks after the study to discuss results and potential treatment options.  Night Terrors Single episode of screaming during sleep approximately a year ago. No injuries or recurrent episodes. -Monitor for recurrence.  Weight Gain Approximately 20 pounds over the last couple of years. -Encourage healthy lifestyle changes for weight loss, which may improve sleep apnea symptoms if present.     Glenford Bayley, NP 10/25/2023

## 2023-11-23 ENCOUNTER — Other Ambulatory Visit (HOSPITAL_BASED_OUTPATIENT_CLINIC_OR_DEPARTMENT_OTHER): Payer: Self-pay

## 2023-11-23 MED ORDER — VITAMIN D (ERGOCALCIFEROL) 1.25 MG (50000 UNIT) PO CAPS
50000.0000 [IU] | ORAL_CAPSULE | ORAL | 0 refills | Status: DC
  Filled 2023-11-23: qty 4, 28d supply, fill #0
  Filled 2024-05-21: qty 4, 28d supply, fill #1
  Filled 2024-06-29: qty 4, 28d supply, fill #2

## 2023-11-28 ENCOUNTER — Other Ambulatory Visit (HOSPITAL_BASED_OUTPATIENT_CLINIC_OR_DEPARTMENT_OTHER): Payer: Self-pay

## 2023-11-28 MED ORDER — MELOXICAM 15 MG PO TABS
15.0000 mg | ORAL_TABLET | Freq: Every day | ORAL | 0 refills | Status: DC
  Filled 2023-11-28: qty 30, 30d supply, fill #0

## 2023-11-28 MED ORDER — TESTOSTERONE CYPIONATE 200 MG/ML IM SOLN
200.0000 mg | INTRAMUSCULAR | 0 refills | Status: DC
  Filled 2023-11-28: qty 2, 28d supply, fill #0

## 2023-11-28 MED ORDER — AMPHETAMINE-DEXTROAMPHETAMINE 10 MG PO TABS
10.0000 mg | ORAL_TABLET | Freq: Every day | ORAL | 0 refills | Status: DC
  Filled 2023-11-28: qty 30, 30d supply, fill #0

## 2023-11-28 MED ORDER — CITALOPRAM HYDROBROMIDE 40 MG PO TABS
40.0000 mg | ORAL_TABLET | Freq: Every day | ORAL | 1 refills | Status: DC
  Filled 2023-11-28: qty 30, 30d supply, fill #0

## 2023-11-28 MED ORDER — EZETIMIBE 10 MG PO TABS
10.0000 mg | ORAL_TABLET | Freq: Every day | ORAL | 0 refills | Status: DC
  Filled 2023-11-28: qty 30, 30d supply, fill #0

## 2023-11-28 MED ORDER — AMPHETAMINE-DEXTROAMPHET ER 30 MG PO CP24
30.0000 mg | ORAL_CAPSULE | Freq: Every morning | ORAL | 0 refills | Status: DC
  Filled 2023-11-28: qty 30, 30d supply, fill #0

## 2023-11-28 MED ORDER — CLONAZEPAM 0.5 MG PO TABS
0.5000 mg | ORAL_TABLET | Freq: Every day | ORAL | 0 refills | Status: DC | PRN
  Filled 2023-11-28: qty 30, 30d supply, fill #0

## 2023-11-28 MED ORDER — BUPROPION HCL ER (XL) 300 MG PO TB24
300.0000 mg | ORAL_TABLET | Freq: Every morning | ORAL | 1 refills | Status: DC
  Filled 2023-11-28: qty 30, 30d supply, fill #0

## 2023-11-28 MED ORDER — PRAVASTATIN SODIUM 40 MG PO TABS
40.0000 mg | ORAL_TABLET | Freq: Every evening | ORAL | 1 refills | Status: DC
  Filled 2023-11-28: qty 30, 30d supply, fill #0

## 2023-11-30 ENCOUNTER — Ambulatory Visit: Payer: 59 | Admitting: Primary Care

## 2023-11-30 DIAGNOSIS — R0681 Apnea, not elsewhere classified: Secondary | ICD-10-CM

## 2024-01-04 ENCOUNTER — Other Ambulatory Visit (HOSPITAL_BASED_OUTPATIENT_CLINIC_OR_DEPARTMENT_OTHER): Payer: Self-pay

## 2024-01-04 MED ORDER — MELOXICAM 15 MG PO TABS
15.0000 mg | ORAL_TABLET | Freq: Every day | ORAL | 0 refills | Status: DC
  Filled 2024-01-04: qty 30, 30d supply, fill #0

## 2024-01-04 MED ORDER — AMPHETAMINE-DEXTROAMPHETAMINE 10 MG PO TABS
10.0000 mg | ORAL_TABLET | Freq: Every day | ORAL | 0 refills | Status: DC
  Filled 2024-01-04: qty 30, 30d supply, fill #0

## 2024-01-04 MED ORDER — EZETIMIBE 10 MG PO TABS
10.0000 mg | ORAL_TABLET | Freq: Every day | ORAL | 0 refills | Status: DC
  Filled 2024-01-04: qty 30, 30d supply, fill #0

## 2024-01-04 MED ORDER — PRAVASTATIN SODIUM 40 MG PO TABS
40.0000 mg | ORAL_TABLET | Freq: Every evening | ORAL | 1 refills | Status: DC
  Filled 2024-01-04: qty 30, 30d supply, fill #0

## 2024-01-04 MED ORDER — CLONAZEPAM 0.5 MG PO TABS
0.5000 mg | ORAL_TABLET | Freq: Every day | ORAL | 0 refills | Status: DC | PRN
  Filled 2024-01-04: qty 30, 30d supply, fill #0

## 2024-01-06 ENCOUNTER — Other Ambulatory Visit (HOSPITAL_BASED_OUTPATIENT_CLINIC_OR_DEPARTMENT_OTHER): Payer: Self-pay

## 2024-01-06 MED ORDER — AZELASTINE HCL 0.1 % NA SOLN
1.0000 | Freq: Two times a day (BID) | NASAL | 12 refills | Status: DC
  Filled 2024-01-06: qty 30, 30d supply, fill #0

## 2024-01-06 MED ORDER — BUPROPION HCL ER (XL) 300 MG PO TB24
300.0000 mg | ORAL_TABLET | Freq: Every morning | ORAL | 1 refills | Status: DC
  Filled 2024-01-06: qty 30, 30d supply, fill #0

## 2024-01-06 MED ORDER — PRAVASTATIN SODIUM 40 MG PO TABS
40.0000 mg | ORAL_TABLET | Freq: Every evening | ORAL | 1 refills | Status: DC
  Filled 2024-01-06: qty 90, 90d supply, fill #0

## 2024-01-06 MED ORDER — CLONAZEPAM 0.5 MG PO TABS
0.5000 mg | ORAL_TABLET | Freq: Every day | ORAL | 0 refills | Status: DC | PRN
  Filled 2024-01-06: qty 30, 30d supply, fill #0

## 2024-01-06 MED ORDER — MELOXICAM 15 MG PO TABS
15.0000 mg | ORAL_TABLET | Freq: Every day | ORAL | 0 refills | Status: DC
  Filled 2024-01-06: qty 30, 30d supply, fill #0

## 2024-01-06 MED ORDER — AMPHETAMINE-DEXTROAMPHET ER 30 MG PO CP24
30.0000 mg | ORAL_CAPSULE | Freq: Every morning | ORAL | 0 refills | Status: DC
  Filled 2024-01-06: qty 30, 30d supply, fill #0

## 2024-01-06 MED ORDER — VITAMIN D (ERGOCALCIFEROL) 1.25 MG (50000 UNIT) PO CAPS
50000.0000 [IU] | ORAL_CAPSULE | ORAL | 0 refills | Status: DC
  Filled 2024-01-06: qty 4, 28d supply, fill #0

## 2024-01-06 MED ORDER — AMPHETAMINE-DEXTROAMPHETAMINE 10 MG PO TABS
10.0000 mg | ORAL_TABLET | Freq: Every day | ORAL | 0 refills | Status: DC
  Filled 2024-01-06: qty 30, 30d supply, fill #0

## 2024-01-06 MED ORDER — EZETIMIBE 10 MG PO TABS
10.0000 mg | ORAL_TABLET | Freq: Every day | ORAL | 0 refills | Status: DC
  Filled 2024-01-06: qty 30, 30d supply, fill #0

## 2024-01-06 MED ORDER — TESTOSTERONE CYPIONATE 200 MG/ML IM SOLN
200.0000 mg | INTRAMUSCULAR | 0 refills | Status: DC
  Filled 2024-01-06: qty 2, 28d supply, fill #0

## 2024-01-26 NOTE — Telephone Encounter (Signed)
Please schedule VV to discuss sleep study results and pt's desire to start CPAP therapy. Thank you!

## 2024-01-31 ENCOUNTER — Other Ambulatory Visit: Payer: Self-pay | Admitting: Primary Care

## 2024-01-31 DIAGNOSIS — G4730 Sleep apnea, unspecified: Secondary | ICD-10-CM

## 2024-01-31 NOTE — Progress Notes (Signed)
 Order placed

## 2024-02-07 ENCOUNTER — Ambulatory Visit: Payer: 59 | Admitting: Primary Care

## 2024-02-20 ENCOUNTER — Other Ambulatory Visit: Payer: Self-pay

## 2024-02-20 ENCOUNTER — Other Ambulatory Visit (HOSPITAL_BASED_OUTPATIENT_CLINIC_OR_DEPARTMENT_OTHER): Payer: Self-pay

## 2024-02-20 MED ORDER — CLONAZEPAM 0.5 MG PO TABS
0.5000 mg | ORAL_TABLET | Freq: Every day | ORAL | 0 refills | Status: DC | PRN
Start: 2024-02-20 — End: 2024-05-15
  Filled 2024-02-20: qty 30, 30d supply, fill #0

## 2024-02-20 MED ORDER — TESTOSTERONE CYPIONATE 200 MG/ML IM SOLN
200.0000 mg | INTRAMUSCULAR | 0 refills | Status: DC
  Filled 2024-02-20: qty 2, 28d supply, fill #0

## 2024-02-20 MED ORDER — EZETIMIBE 10 MG PO TABS
10.0000 mg | ORAL_TABLET | Freq: Every day | ORAL | 0 refills | Status: DC
  Filled 2024-02-20: qty 30, 30d supply, fill #0

## 2024-02-20 MED ORDER — VITAMIN D (ERGOCALCIFEROL) 1.25 MG (50000 UNIT) PO CAPS
50000.0000 [IU] | ORAL_CAPSULE | ORAL | 0 refills | Status: DC
Start: 2024-02-20 — End: 2024-05-15
  Filled 2024-02-20: qty 4, 28d supply, fill #0

## 2024-02-20 MED ORDER — AMPHETAMINE-DEXTROAMPHET ER 30 MG PO CP24
30.0000 mg | ORAL_CAPSULE | Freq: Every morning | ORAL | 0 refills | Status: DC
  Filled 2024-02-20: qty 30, 30d supply, fill #0

## 2024-02-20 MED ORDER — BUPROPION HCL ER (XL) 300 MG PO TB24
300.0000 mg | ORAL_TABLET | Freq: Every morning | ORAL | 1 refills | Status: DC
  Filled 2024-02-20: qty 30, 30d supply, fill #0
  Filled 2024-05-21: qty 30, 30d supply, fill #1
  Filled 2024-06-29: qty 30, 30d supply, fill #2
  Filled 2024-07-31: qty 30, 30d supply, fill #3
  Filled 2024-08-31: qty 30, 30d supply, fill #4
  Filled 2024-10-09: qty 30, 30d supply, fill #5

## 2024-02-20 MED ORDER — MELOXICAM 15 MG PO TABS
15.0000 mg | ORAL_TABLET | Freq: Every day | ORAL | 0 refills | Status: DC
  Filled 2024-02-20: qty 30, 30d supply, fill #0

## 2024-02-20 MED ORDER — AMPHETAMINE-DEXTROAMPHETAMINE 10 MG PO TABS
10.0000 mg | ORAL_TABLET | Freq: Every day | ORAL | 0 refills | Status: DC
Start: 2024-02-20 — End: 2024-05-15
  Filled 2024-02-20: qty 30, 30d supply, fill #0

## 2024-02-20 MED ORDER — CITALOPRAM HYDROBROMIDE 40 MG PO TABS
40.0000 mg | ORAL_TABLET | Freq: Every day | ORAL | 1 refills | Status: DC
  Filled 2024-02-20: qty 30, 30d supply, fill #0
  Filled 2024-05-21: qty 30, 30d supply, fill #1
  Filled 2024-06-29: qty 30, 30d supply, fill #2
  Filled 2024-07-31: qty 30, 30d supply, fill #3
  Filled 2024-08-31: qty 30, 30d supply, fill #4
  Filled 2024-10-09: qty 30, 30d supply, fill #5

## 2024-02-20 MED ORDER — PRAVASTATIN SODIUM 40 MG PO TABS
40.0000 mg | ORAL_TABLET | Freq: Every evening | ORAL | 1 refills | Status: DC
  Filled 2024-02-20: qty 30, 30d supply, fill #0

## 2024-03-12 ENCOUNTER — Other Ambulatory Visit (HOSPITAL_BASED_OUTPATIENT_CLINIC_OR_DEPARTMENT_OTHER): Payer: Self-pay

## 2024-03-13 ENCOUNTER — Other Ambulatory Visit (HOSPITAL_BASED_OUTPATIENT_CLINIC_OR_DEPARTMENT_OTHER): Payer: Self-pay

## 2024-03-13 MED ORDER — ZEPBOUND 2.5 MG/0.5ML ~~LOC~~ SOAJ
2.5000 mg | SUBCUTANEOUS | 0 refills | Status: DC
  Filled 2024-03-13: qty 2, 28d supply, fill #0

## 2024-03-14 ENCOUNTER — Other Ambulatory Visit (HOSPITAL_BASED_OUTPATIENT_CLINIC_OR_DEPARTMENT_OTHER): Payer: Self-pay

## 2024-03-14 ENCOUNTER — Other Ambulatory Visit: Payer: Self-pay

## 2024-03-14 MED ORDER — AMPHETAMINE-DEXTROAMPHET ER 30 MG PO CP24
30.0000 mg | ORAL_CAPSULE | Freq: Every morning | ORAL | 0 refills | Status: DC
  Filled 2024-03-19: qty 30, 30d supply, fill #0

## 2024-03-14 MED ORDER — CLONAZEPAM 0.5 MG PO TABS
0.5000 mg | ORAL_TABLET | Freq: Every day | ORAL | 0 refills | Status: DC | PRN
  Filled 2024-03-19: qty 30, 30d supply, fill #0

## 2024-03-14 MED ORDER — VITAMIN D (ERGOCALCIFEROL) 1.25 MG (50000 UNIT) PO CAPS
50000.0000 [IU] | ORAL_CAPSULE | ORAL | 0 refills | Status: DC
  Filled 2024-03-14: qty 4, 28d supply, fill #0

## 2024-03-14 MED ORDER — BUPROPION HCL ER (XL) 300 MG PO TB24
300.0000 mg | ORAL_TABLET | Freq: Every morning | ORAL | 1 refills | Status: DC
  Filled 2024-03-14: qty 30, 30d supply, fill #0

## 2024-03-14 MED ORDER — MELOXICAM 15 MG PO TABS
15.0000 mg | ORAL_TABLET | Freq: Every day | ORAL | 0 refills | Status: DC
  Filled 2024-03-14: qty 30, 30d supply, fill #0

## 2024-03-14 MED ORDER — EZETIMIBE 10 MG PO TABS
10.0000 mg | ORAL_TABLET | Freq: Every day | ORAL | 0 refills | Status: DC
  Filled 2024-03-14: qty 30, 30d supply, fill #0

## 2024-03-14 MED ORDER — AMPHETAMINE-DEXTROAMPHETAMINE 10 MG PO TABS
10.0000 mg | ORAL_TABLET | Freq: Every day | ORAL | 0 refills | Status: DC
  Filled 2024-03-19: qty 30, 30d supply, fill #0

## 2024-03-14 MED ORDER — TESTOSTERONE CYPIONATE 200 MG/ML IM SOLN
200.0000 mg | INTRAMUSCULAR | 0 refills | Status: DC
  Filled 2024-03-17: qty 2, 28d supply, fill #0

## 2024-03-17 ENCOUNTER — Other Ambulatory Visit (HOSPITAL_BASED_OUTPATIENT_CLINIC_OR_DEPARTMENT_OTHER): Payer: Self-pay

## 2024-03-19 ENCOUNTER — Other Ambulatory Visit (HOSPITAL_BASED_OUTPATIENT_CLINIC_OR_DEPARTMENT_OTHER): Payer: Self-pay

## 2024-04-10 ENCOUNTER — Other Ambulatory Visit (HOSPITAL_BASED_OUTPATIENT_CLINIC_OR_DEPARTMENT_OTHER): Payer: Self-pay

## 2024-04-11 ENCOUNTER — Other Ambulatory Visit (HOSPITAL_BASED_OUTPATIENT_CLINIC_OR_DEPARTMENT_OTHER): Payer: Self-pay

## 2024-04-11 MED ORDER — PRAVASTATIN SODIUM 80 MG PO TABS
80.0000 mg | ORAL_TABLET | Freq: Every evening | ORAL | 0 refills | Status: DC
Start: 2024-04-11 — End: 2024-05-15
  Filled 2024-04-11: qty 30, 30d supply, fill #0

## 2024-04-11 MED ORDER — AMPHETAMINE-DEXTROAMPHET ER 30 MG PO CP24
30.0000 mg | ORAL_CAPSULE | Freq: Every morning | ORAL | 0 refills | Status: DC
  Filled 2024-04-11 – 2024-04-18 (×3): qty 30, 30d supply, fill #0

## 2024-04-11 MED ORDER — AMPHETAMINE-DEXTROAMPHETAMINE 10 MG PO TABS
10.0000 mg | ORAL_TABLET | Freq: Every day | ORAL | 0 refills | Status: DC
  Filled 2024-04-11 – 2024-04-18 (×3): qty 30, 30d supply, fill #0

## 2024-04-11 MED ORDER — BUPROPION HCL ER (XL) 300 MG PO TB24
300.0000 mg | ORAL_TABLET | Freq: Every morning | ORAL | 1 refills | Status: DC
  Filled 2024-04-11: qty 30, 30d supply, fill #0

## 2024-04-11 MED ORDER — EZETIMIBE 10 MG PO TABS
10.0000 mg | ORAL_TABLET | Freq: Every day | ORAL | 0 refills | Status: DC
  Filled 2024-04-11: qty 30, 30d supply, fill #0

## 2024-04-11 MED ORDER — CLONAZEPAM 0.5 MG PO TABS
0.5000 mg | ORAL_TABLET | Freq: Every day | ORAL | 0 refills | Status: DC | PRN
  Filled 2024-04-11 – 2024-04-18 (×3): qty 30, 30d supply, fill #0

## 2024-04-11 MED ORDER — TESTOSTERONE CYPIONATE 200 MG/ML IM SOLN
200.0000 mg | INTRAMUSCULAR | 0 refills | Status: DC
Start: 2024-04-11 — End: 2024-05-15
  Filled 2024-04-11: qty 2, 28d supply, fill #0

## 2024-04-11 MED ORDER — ZEPBOUND 5 MG/0.5ML ~~LOC~~ SOAJ
5.0000 mg | SUBCUTANEOUS | 0 refills | Status: DC
  Filled 2024-04-11: qty 2, 28d supply, fill #0

## 2024-04-11 MED ORDER — CITALOPRAM HYDROBROMIDE 40 MG PO TABS
40.0000 mg | ORAL_TABLET | Freq: Every day | ORAL | 1 refills | Status: DC
  Filled 2024-04-11: qty 30, 30d supply, fill #0

## 2024-04-14 ENCOUNTER — Other Ambulatory Visit (HOSPITAL_BASED_OUTPATIENT_CLINIC_OR_DEPARTMENT_OTHER): Payer: Self-pay

## 2024-04-16 ENCOUNTER — Other Ambulatory Visit (HOSPITAL_BASED_OUTPATIENT_CLINIC_OR_DEPARTMENT_OTHER): Payer: Self-pay

## 2024-04-18 ENCOUNTER — Other Ambulatory Visit (HOSPITAL_BASED_OUTPATIENT_CLINIC_OR_DEPARTMENT_OTHER): Payer: Self-pay

## 2024-05-11 ENCOUNTER — Other Ambulatory Visit (HOSPITAL_BASED_OUTPATIENT_CLINIC_OR_DEPARTMENT_OTHER): Payer: Self-pay

## 2024-05-11 MED ORDER — ZEPBOUND 5 MG/0.5ML ~~LOC~~ SOAJ
5.0000 mg | SUBCUTANEOUS | 0 refills | Status: DC
  Filled 2024-05-11: qty 2, 28d supply, fill #0

## 2024-05-15 ENCOUNTER — Ambulatory Visit: Admitting: Primary Care

## 2024-05-15 ENCOUNTER — Encounter: Payer: Self-pay | Admitting: Primary Care

## 2024-05-15 VITALS — BP 136/74 | HR 86 | Temp 97.7°F | Ht 72.0 in | Wt 264.6 lb

## 2024-05-15 DIAGNOSIS — G4730 Sleep apnea, unspecified: Secondary | ICD-10-CM

## 2024-05-15 NOTE — Patient Instructions (Addendum)
 -  OBSTRUCTIVE SLEEP APNEA: Obstructive sleep apnea is a condition where your breathing stops and starts repeatedly during sleep due to blocked airways. Your sleep study showed mild sleep apnea with 11 apneic events per hour. Since starting CPAP therapy, your sleep quality has improved, and your snoring has decreased. To address your difficulty breathing in the second half of the night, we will set your CPAP pressure to a constant 9 cm H2O. You should contact the medical supply company to switch from a full face mask to a nasal mask, as facial hair may be affecting the seal. Remember to replace your CPAP supplies regularly: cushion every 2-4 weeks, mask and tubing every 3 months, headgear and water chamber every 6 months, and filter every month. Always use distilled water in your CPAP machine.  INSTRUCTIONS: Please contact the medical supply company to switch from a full face mask to a nasal mask. We will schedule a six-month virtual follow-up to monitor your CPAP therapy and renew supplies annually if your therapy remains consistent.  Follow-up 6 month virtual visit with [facility] NP or sooner if needed/ then annually for CPAP supply renewal

## 2024-05-15 NOTE — Progress Notes (Signed)
 @Patient  ID: Peter Cline, male    DOB: 12-18-1975, 48 y.o.   MRN: [id number]  Chief Complaint  Patient presents with   Follow-up    CPAP f/u     Referring provider: Jearlean Mince, Arlester Ladd*  HPI: 48 year old male, never smoked. PMH significant for  ADD, depression, hyperlipidemia, testosterone  deficiency, hyperlipidemia.  Previous [facility] encounter:  10/25/2023 Discussed the use of AI scribe software for clinical note transcription with the patient, who gave verbal consent to proceed.   History of Present Illness   The patient presents for a sleep consultation due to concerns of frequent awakenings, non-restorative sleep, and snoring. He reports that his spouse has observed episodes of apnea during sleep. These symptoms have been ongoing for a couple of years, but the patient notes a worsening in the past year, particularly feeling tired upon waking and throughout the day. He has occasionally woken up gasping or choking, but this is rare. Despite the snoring, the patient primarily sleeps on his side.   Over the past couple of years, the patient has gained approximately twenty pounds. He reports falling asleep quickly but wakes up five to six times per night due to restlessness and tossing and turning. A home sleep study was conducted a few years ago when the symptoms began, but the results were inconclusive.   The patient also reports occasional sleep talking and a single episode of screaming during sleep about a year ago similar to a night terror. No other significant sleep behaviors. He denies symptoms of narcolepsy or sudden sleep attacks during the day. His father has sleep apnea and wears a CPAP.       Sleep questionnaire Symptoms-   wakes up frequently, non-restorative sleep, snoring  Prior sleep study- yes, home study was inconclusive several years back  Bedtime-10-11pm Time to fall asleep- 15 mins Nocturnal awakenings- 5-6 times  Out of bed/start of day-  5:30am Weight changes- 20 lbs Do you operate heavy machinery- yes Do you currently wear CPAP- no Do you current wear oxygen- no Epworth- 14    Suspected Obstructive Sleep Apnea Reports of snoring, witnessed apneas, unrefreshing sleep, and nocturnal restlessness. Previous home sleep study was inconclusive. Recent weight gain. No history of cardiopulmonary disease, seizures, or narcolepsy. -Reviewed risks of untreated sleep apnea including cardiac arrhythmias, pulmonary hypertension, diabetes and stroke.  We also discussed treatment options including weight loss, oral appliance, CPAP therapy referral to ENT for possible surgical options -Ordered for a home sleep study with [facility]. -Encouraged weight loss and side sleeping position.  Advised patient against driving if experiencing excessive daytime sleepiness or fatigue. -Follow-up in 1-2 weeks after the study to discuss results and potential treatment options.  Night Terrors Single episode of screaming during sleep approximately a year ago. No injuries or recurrent episodes. -Monitor for recurrence.  Weight Gain Approximately 20 pounds over the last couple of years. -Encourage healthy lifestyle changes for weight loss, which may improve sleep apnea symptoms if present.    05/15/2024- INTERIM  Discussed the use of AI scribe software for clinical note transcription with the patient, who gave verbal consent to proceed.  History of Present Illness   [name] is a 48 year old male who presents for a follow-up after starting CPAP therapy for mild sleep apnea.  Home sleep study in 11/30/23 showed mild 11.8/hour with spo2 low 86%  He is doing well with the CPAP, noting an improvement in his sleep quality and feeling more  rested upon waking. His snoring has decreased, and his wife has not observed any witnessed apneas since he began using the CPAP.  He experiences difficulty breathing through the CPAP in the morning,  describing it as 'almost like the machine's off' and finds it hard to breathe in. He uses a full face mask but plans to switch to a nasal mask due to facial hair potentially affecting the fit.  He does not report any significant air leaks and denies being a mouth breather. No night terrors.      Airview download 04/14/24-05/13/24 30/30 days (100%) >4 hours Average usage 8 hours 19 mins Pressure 5-15cm h20 (9.2cm h20-95%) Airleaks 19L/min (95%) AHI 0.6   Allergies  Allergen Reactions   Latex     Hands break out    Immunization History  Administered Date(s) Administered   PFIZER(Purple Top)SARS-COV-2 Vaccination 02/14/2020, 03/13/2020, 10/13/2020   Pfizer Covid-19 Vaccine Bivalent Booster 47yrs & up 09/01/2021    No past medical history on file.  Tobacco History: Social History   Tobacco Use  Smoking Status Never  Smokeless Tobacco Never   Counseling given: Not Answered   Outpatient Medications Prior to Visit  Medication Sig Dispense Refill   amphetamine -dextroamphetamine  (ADDERALL  XR) 30 MG 24 hr capsule Take 1 capsule (30 mg total) by mouth in the morning. 30 capsule 0   amphetamine -dextroamphetamine  (ADDERALL ) 10 MG tablet Take 1 tablet (10 mg total) by mouth daily. 30 tablet 0   buPROPion  (WELLBUTRIN  XL) 300 MG 24 hr tablet Take 1 tablet (300 mg total) by mouth every morning. 90 tablet 1   citalopram  (CELEXA ) 40 MG tablet Take 1 tablet (40 mg total) by mouth daily. 90 tablet 1   clonazePAM  (KLONOPIN ) 0.5 MG tablet Take one tablet (0.5 mg dose) by mouth daily as needed for Anxiety. 30 tablet 0   ezetimibe  (ZETIA ) 10 MG tablet Take 1 tablet (10 mg total) by mouth daily. 90 tablet 0   meloxicam  (MOBIC ) 15 MG tablet Take 1 tablet (15 mg total) by mouth daily. 30 tablet 0   Na Sulfate-K Sulfate-Mg Sulf 17.5-3.13-1.6 GM/177ML SOLN TAKE 177 MLS BY MOUTH 2 TIMES. 354 mL 0   pravastatin  (PRAVACHOL ) 40 MG tablet Take 1 tablet (40 mg total) by mouth every evening. 90 tablet 1    testosterone  cypionate (DEPOTESTOSTERONE CYPIONATE) 200 MG/ML injection Inject 1 mL (200 mg total) into the muscle every 14 (fourteen) days. 10 mL 0   tirzepatide  (ZEPBOUND ) 5 MG/0.5ML Pen Inject 5 mg into the skin once a week. 2 mL 0   Vitamin D , Ergocalciferol , (DRISDOL ) 1.25 MG (50000 UNIT) CAPS capsule Take 1 capsule (50,000 Units total) by mouth every 7 (seven) days at 0900 for 12 weeks 12 capsule 0   amphetamine -dextroamphetamine  (ADDERALL ) 10 MG tablet Take 1 tablet (10 mg total) by mouth daily. (Patient not taking: Reported on 05/15/2024) 30 tablet 0   amphetamine -dextroamphetamine  (ADDERALL ) 10 MG tablet Take 1 tablet (10 mg total) by mouth daily. (Patient not taking: Reported on 05/15/2024) 30 tablet 0   amphetamine -dextroamphetamine  (ADDERALL ) 10 MG tablet Take 1 tablet (10 mg total) by mouth daily. (Patient not taking: Reported on 05/15/2024) 30 tablet 0   amphetamine -dextroamphetamine  (ADDERALL ) 10 MG tablet Take 1 tablet (10 mg total) by mouth daily. (Patient not taking: Reported on 05/15/2024) 30 tablet 0   azelastine  (ASTELIN ) 0.1 % nasal spray Place 1 spray into each notril 2 (two) times daily. (Patient not taking: Reported on 05/15/2024) 30 mL 12   azelastine  (ASTELIN ) 0.1 %  nasal spray Place 1 spray into both nostrils 2 (two) times daily. (Patient not taking: Reported on 05/15/2024) 30 mL 12   buPROPion  (WELLBUTRIN  XL) 300 MG 24 hr tablet Take 1 tablet (300 mg total) by mouth every morning. (Patient not taking: Reported on 05/15/2024) 90 tablet 1   buPROPion  (WELLBUTRIN  XL) 300 MG 24 hr tablet Take 1 tablet (300 mg total) by mouth every morning. (Patient not taking: Reported on 05/15/2024) 90 tablet 1   buPROPion  (WELLBUTRIN  XL) 300 MG 24 hr tablet Take 1 tablet (300 mg total) by mouth every morning. (Patient not taking: Reported on 05/15/2024) 90 tablet 1   buPROPion  (WELLBUTRIN  XL) 300 MG 24 hr tablet Take 1 tablet (300 mg total) by mouth every morning. (Patient not taking: Reported on 05/15/2024) 90  tablet 1   buPROPion  (WELLBUTRIN  XL) 300 MG 24 hr tablet Take 1 tablet (300 mg total) by mouth in the morning. (Patient not taking: Reported on 05/15/2024) 90 tablet 1   citalopram  (CELEXA ) 40 MG tablet Take 1 tablet (40 mg total) by mouth daily. (Patient not taking: Reported on 05/15/2024) 90 tablet 1   citalopram  (CELEXA ) 40 MG tablet Take 1 tablet (40 mg total) by mouth daily. (Patient not taking: Reported on 05/15/2024) 90 tablet 1   citalopram  (CELEXA ) 40 MG tablet Take 1 tablet (40 mg total) by mouth daily. (Patient not taking: Reported on 05/15/2024) 90 tablet 1   clonazePAM  (KLONOPIN ) 0.5 MG tablet Take 1 tablet (0.5 mg total) by mouth daily as needed as needed. (Patient not taking: Reported on 05/15/2024) 30 tablet 0   clonazePAM  (KLONOPIN ) 0.5 MG tablet Take 1 tablet (0.5 mg total) by mouth daily as needed for anxiety (Patient not taking: Reported on 05/15/2024) 30 tablet 0   clonazePAM  (KLONOPIN ) 0.5 MG tablet Take 1 tablet (0.5 mg total) by mouth daily as needed for anxiety (Patient not taking: Reported on 05/15/2024) 30 tablet 0   clonazePAM  (KLONOPIN ) 0.5 MG tablet Take 1 tablet (0.5 mg total) by mouth daily as needed for anxiety (Patient not taking: Reported on 05/15/2024) 30 tablet 0   clonazePAM  (KLONOPIN ) 0.5 MG tablet Take 1 tablet (0.5 mg total) by mouth daily as needed for Anxiety. (Patient not taking: Reported on 05/15/2024) 30 tablet 0   clonazePAM  (KLONOPIN ) 0.5 MG tablet Take 1 tablet (0.5 mg total) by mouth daily as needed for anxiety (Patient not taking: Reported on 05/15/2024) 30 tablet 0   ezetimibe  (ZETIA ) 10 MG tablet Take 1 tablet (10 mg total) by mouth daily. (Patient not taking: Reported on 05/15/2024) 90 tablet 0   ezetimibe  (ZETIA ) 10 MG tablet Take 1 tablet (10 mg total) by mouth daily. (Patient not taking: Reported on 05/15/2024) 90 tablet 0   ezetimibe  (ZETIA ) 10 MG tablet Take 1 tablet (10 mg total) by mouth daily. (Patient not taking: Reported on 05/15/2024) 90 tablet 0   ezetimibe   (ZETIA ) 10 MG tablet Take 1 tablet (10 mg total) by mouth daily. (Patient not taking: Reported on 05/15/2024) 90 tablet 0   ezetimibe  (ZETIA ) 10 MG tablet Take 1 tablet (10 mg total) by mouth daily. (Patient not taking: Reported on 05/15/2024) 90 tablet 0   ezetimibe  (ZETIA ) 10 MG tablet Take 1 tablet (10 mg total) by mouth daily. (Patient not taking: Reported on 05/15/2024) 90 tablet 0   guaiFENesin (MUCINEX) 600 MG 12 hr tablet Take 600 mg by mouth daily as needed for cough or to loosen phlegm. (Patient not taking: Reported on 05/15/2024)  ibuprofen (ADVIL,MOTRIN) 200 MG tablet Take 400 mg by mouth every 6 (six) hours as needed for headache or mild pain. (Patient not taking: Reported on 05/15/2024)     meloxicam  (MOBIC ) 15 MG tablet Take 1 tablet (15 mg total) by mouth daily. (Patient not taking: Reported on 05/15/2024) 30 tablet 0   meloxicam  (MOBIC ) 15 MG tablet Take 1 tablet (15 mg total) by mouth daily. (Patient not taking: Reported on 05/15/2024) 30 tablet 0   meloxicam  (MOBIC ) 15 MG tablet Take 1 tablet (15 mg total) by mouth daily. (Patient not taking: Reported on 05/15/2024) 30 tablet 0   pravastatin  (PRAVACHOL ) 40 MG tablet Take 1 tablet (40 mg total) by mouth every evening. (Patient not taking: Reported on 05/15/2024) 90 tablet 1   pravastatin  (PRAVACHOL ) 40 MG tablet Take 1 tablet (40 mg total) by mouth every evening. (Patient not taking: Reported on 05/15/2024) 90 tablet 1   pravastatin  (PRAVACHOL ) 40 MG tablet Take 1 tablet (40 mg total) by mouth every evening. (Patient not taking: Reported on 05/15/2024) 90 tablet 1   pravastatin  (PRAVACHOL ) 80 MG tablet Take 1 tablet (80 mg total) by mouth every evening. (Patient not taking: Reported on 05/15/2024) 90 tablet 0   testosterone  cypionate (DEPOTESTOSTERONE CYPIONATE) 200 MG/ML injection Inject 1 mL (200 mg total) into the muscle every 14 (fourteen) days. (Patient not taking: Reported on 05/15/2024) 10 mL 0   testosterone  cypionate (DEPOTESTOSTERONE CYPIONATE) 200  MG/ML injection Inject 1 mL (200 mg total) into the muscle every 14 (fourteen) days. (Patient not taking: Reported on 05/15/2024) 10 mL 0   testosterone  cypionate (DEPOTESTOSTERONE CYPIONATE) 200 MG/ML injection Inject 1 mL (200 mg total) into the muscle every 14 (fourteen) days. (Patient not taking: Reported on 05/15/2024) 10 mL 0   testosterone  cypionate (DEPOTESTOSTERONE CYPIONATE) 200 MG/ML injection Inject 1 mL (200 mg total) into the muscle every 14 (fourteen) days. (Patient not taking: Reported on 05/15/2024) 10 mL 0   testosterone  cypionate (DEPOTESTOSTERONE CYPIONATE) 200 MG/ML injection Inject 1 mL (200 mg total) into the muscle every 14 (fourteen) days. (Patient not taking: Reported on 05/15/2024) 10 mL 0   Vitamin D , Ergocalciferol , (DRISDOL ) 1.25 MG (50000 UNIT) CAPS capsule Take 1 capsule (50,000 Units total) by mouth once a week at 9 am (Patient not taking: Reported on 05/15/2024) 12 capsule 0   Vitamin D , Ergocalciferol , (DRISDOL ) 1.25 MG (50000 UNIT) CAPS capsule Take 1 capsule (50,000 Units total) by mouth once a week at 9 am (Patient not taking: Reported on 05/15/2024) 12 capsule 0   Vitamin D , Ergocalciferol , (DRISDOL ) 1.25 MG (50000 UNIT) CAPS capsule Take 1 capsule (50,000 Units total) by mouth once a week . (Patient not taking: Reported on 05/15/2024) 12 capsule 0   No facility-administered medications prior to visit.   Review of Systems  Review of Systems  Constitutional: Negative.  Negative for fatigue.  HENT: Negative.    Respiratory: Negative.    Psychiatric/Behavioral:  Negative for sleep disturbance.    Physical Exam  BP 136/74 (BP Location: Left Arm, Patient Position: Sitting, Cuff Size: Normal)   Pulse 86   Temp 97.7 F (36.5 C) (Temporal)   Ht 6' (1.829 m)   Wt 264 lb 9.6 oz (120 kg)   SpO2 97%   BMI 35.89 kg/m  Physical Exam Constitutional:      Appearance: Normal appearance.  HENT:     Head: Normocephalic and atraumatic.  Cardiovascular:     Rate and Rhythm:  Normal rate and regular rhythm.  Pulmonary:     Effort: Pulmonary effort is normal.     Breath sounds: Normal breath sounds. No wheezing, rhonchi or rales.  Musculoskeletal:        General: Normal range of motion.  Skin:    General: Skin is warm and dry.  Neurological:     General: No focal deficit present.     Mental Status: He is alert and oriented to person, place, and time. Mental status is at baseline.  Psychiatric:        Mood and Affect: Mood normal.        Behavior: Behavior normal.        Thought Content: Thought content normal.        Judgment: Judgment normal.      Lab Results:  CBC    Component Value Date/Time   WBC 8.8 03/06/2023 1422   RBC 4.75 03/06/2023 1422   HGB 15.2 03/06/2023 1422   HCT 42.9 03/06/2023 1422   PLT 288 03/06/2023 1422   MCV 90.3 03/06/2023 1422   MCH 32.0 03/06/2023 1422   MCHC 35.4 03/06/2023 1422   RDW 12.3 03/06/2023 1422   LYMPHSABS 1.7 03/06/2023 1422   MONOABS 0.8 03/06/2023 1422   EOSABS 0.1 03/06/2023 1422   BASOSABS 0.0 03/06/2023 1422    BMET    Component Value Date/Time   NA 138 03/06/2023 1422   K 3.7 03/06/2023 1422   CL 104 03/06/2023 1422   CO2 26 03/06/2023 1422   GLUCOSE 125 (H) 03/06/2023 1422   BUN 11 03/06/2023 1422   CREATININE 0.95 03/06/2023 1422   CALCIUM 9.2 03/06/2023 1422   GFRNONAA >60 03/06/2023 1422   GFRAA >90 10/24/2013 1835    BNP No results found for: "BNP"  ProBNP No results found for: "PROBNP"  Imaging: No results found.   Assessment & Plan:   No problem-specific Assessment & Plan notes found for this encounter.  Assessment and Plan    Obstructive Sleep Apnea Mild obstructive sleep apnea with 11 apneic events per hour on December sleep study. Reports improved sleep quality, feeling more rested, and reduced snoring and witnessed apneas since initiating CPAP therapy. Experiences difficulty breathing in the second half of the night possibly related to pressure settings. Current  CPAP settings show 100% usage with an average pressure of 7 cm H2O and a maximum pressure of 10.6 cm H2O. Considering switching from a full face mask to a nasal mask due to facial hair affecting the seal. No significant air leaks reported. Apneic events are 0.6 per hour with current auto settings. - Set CPAP pressure to 9 cm H2O to address breathing difficulty in the second half of the night. - Instruct him to contact the medical supply company to switch from a full face mask to a nasal mask. - Advise on CPAP supply replacement schedule: replace cushion every 2-4 weeks, mask and tubing every 3 months, headgear and water chamber every 6 months, and filter every month. - Remind to use distilled water in CPAP machine. - Schedule a six-month virtual follow-up to monitor CPAP therapy and renew supplies annually if therapy remains consistent.   Antonio Baumgarten, NP 05/15/2024

## 2024-05-21 ENCOUNTER — Other Ambulatory Visit (HOSPITAL_BASED_OUTPATIENT_CLINIC_OR_DEPARTMENT_OTHER): Payer: Self-pay

## 2024-05-21 ENCOUNTER — Other Ambulatory Visit: Payer: Self-pay

## 2024-05-21 MED ORDER — MELOXICAM 15 MG PO TABS
15.0000 mg | ORAL_TABLET | Freq: Every day | ORAL | 0 refills | Status: DC
  Filled 2024-05-21: qty 30, 30d supply, fill #0

## 2024-05-21 MED ORDER — AMPHETAMINE-DEXTROAMPHET ER 30 MG PO CP24
30.0000 mg | ORAL_CAPSULE | Freq: Every morning | ORAL | 0 refills | Status: DC
  Filled 2024-05-21: qty 30, 30d supply, fill #0

## 2024-05-21 MED ORDER — CLONAZEPAM 0.5 MG PO TABS
0.5000 mg | ORAL_TABLET | Freq: Every day | ORAL | 0 refills | Status: DC | PRN
  Filled 2024-05-21: qty 30, 30d supply, fill #0

## 2024-05-21 MED ORDER — AMPHETAMINE-DEXTROAMPHETAMINE 10 MG PO TABS
10.0000 mg | ORAL_TABLET | Freq: Every day | ORAL | 0 refills | Status: DC
  Filled 2024-05-21: qty 30, 30d supply, fill #0

## 2024-05-21 MED ORDER — TESTOSTERONE CYPIONATE 200 MG/ML IM SOLN
200.0000 mg | INTRAMUSCULAR | 0 refills | Status: DC
  Filled 2024-05-21: qty 2, 28d supply, fill #0
  Filled 2024-06-29: qty 2, 28d supply, fill #1
  Filled 2024-07-31: qty 2, 28d supply, fill #2
  Filled 2024-08-31: qty 2, 28d supply, fill #3
  Filled 2024-10-09: qty 2, 28d supply, fill #4

## 2024-06-06 ENCOUNTER — Other Ambulatory Visit (HOSPITAL_BASED_OUTPATIENT_CLINIC_OR_DEPARTMENT_OTHER): Payer: Self-pay

## 2024-06-06 MED ORDER — ZEPBOUND 7.5 MG/0.5ML ~~LOC~~ SOAJ
7.5000 mg | SUBCUTANEOUS | 0 refills | Status: DC
  Filled 2024-06-06: qty 2, 28d supply, fill #0

## 2024-06-29 ENCOUNTER — Other Ambulatory Visit: Payer: Self-pay

## 2024-06-29 ENCOUNTER — Other Ambulatory Visit (HOSPITAL_BASED_OUTPATIENT_CLINIC_OR_DEPARTMENT_OTHER): Payer: Self-pay

## 2024-06-29 MED ORDER — AMPHETAMINE-DEXTROAMPHET ER 30 MG PO CP24
30.0000 mg | ORAL_CAPSULE | Freq: Every morning | ORAL | 0 refills | Status: DC
  Filled 2024-06-29: qty 30, 30d supply, fill #0

## 2024-06-29 MED ORDER — AMPHETAMINE-DEXTROAMPHETAMINE 10 MG PO TABS
10.0000 mg | ORAL_TABLET | Freq: Every day | ORAL | 0 refills | Status: DC
  Filled 2024-06-29: qty 30, 30d supply, fill #0

## 2024-06-29 MED ORDER — ZEPBOUND 7.5 MG/0.5ML ~~LOC~~ SOAJ
7.5000 mg | SUBCUTANEOUS | 0 refills | Status: DC
  Filled 2024-06-29: qty 2, 28d supply, fill #0

## 2024-06-29 MED ORDER — MELOXICAM 15 MG PO TABS
15.0000 mg | ORAL_TABLET | Freq: Every day | ORAL | 0 refills | Status: DC
  Filled 2024-06-29: qty 30, 30d supply, fill #0

## 2024-06-29 MED ORDER — CLONAZEPAM 0.5 MG PO TABS
0.5000 mg | ORAL_TABLET | Freq: Every day | ORAL | 0 refills | Status: DC | PRN
  Filled 2024-06-29: qty 30, 30d supply, fill #0

## 2024-07-31 ENCOUNTER — Other Ambulatory Visit: Payer: Self-pay

## 2024-07-31 ENCOUNTER — Other Ambulatory Visit (HOSPITAL_BASED_OUTPATIENT_CLINIC_OR_DEPARTMENT_OTHER): Payer: Self-pay

## 2024-07-31 MED ORDER — MELOXICAM 15 MG PO TABS
15.0000 mg | ORAL_TABLET | Freq: Every day | ORAL | 0 refills | Status: DC
  Filled 2024-07-31: qty 30, 30d supply, fill #0

## 2024-07-31 MED ORDER — AMPHETAMINE-DEXTROAMPHETAMINE 10 MG PO TABS
10.0000 mg | ORAL_TABLET | Freq: Every day | ORAL | 0 refills | Status: DC
  Filled 2024-07-31: qty 30, 30d supply, fill #0

## 2024-07-31 MED ORDER — CLONAZEPAM 0.5 MG PO TABS
0.5000 mg | ORAL_TABLET | Freq: Every day | ORAL | 0 refills | Status: DC | PRN
  Filled 2024-07-31: qty 30, 30d supply, fill #0

## 2024-07-31 MED ORDER — ZEPBOUND 7.5 MG/0.5ML ~~LOC~~ SOAJ
7.5000 mg | SUBCUTANEOUS | 0 refills | Status: DC
  Filled 2024-07-31: qty 2, 28d supply, fill #0

## 2024-07-31 MED ORDER — EZETIMIBE 10 MG PO TABS
10.0000 mg | ORAL_TABLET | Freq: Every day | ORAL | 0 refills | Status: DC
  Filled 2024-07-31: qty 30, 30d supply, fill #0
  Filled 2024-08-31: qty 30, 30d supply, fill #1
  Filled 2024-10-09: qty 30, 30d supply, fill #2

## 2024-07-31 MED ORDER — VITAMIN D (ERGOCALCIFEROL) 1.25 MG (50000 UNIT) PO CAPS
ORAL_CAPSULE | ORAL | 0 refills | Status: DC
  Filled 2024-07-31: qty 4, 28d supply, fill #0
  Filled 2024-08-31: qty 4, 28d supply, fill #1
  Filled 2024-10-09: qty 4, 28d supply, fill #2

## 2024-07-31 MED ORDER — AMPHETAMINE-DEXTROAMPHET ER 30 MG PO CP24
30.0000 mg | ORAL_CAPSULE | Freq: Every morning | ORAL | 0 refills | Status: DC
  Filled 2024-07-31: qty 30, 30d supply, fill #0

## 2024-08-08 ENCOUNTER — Other Ambulatory Visit (HOSPITAL_BASED_OUTPATIENT_CLINIC_OR_DEPARTMENT_OTHER): Payer: Self-pay

## 2024-08-08 MED ORDER — ZEPBOUND 10 MG/0.5ML ~~LOC~~ SOAJ
10.0000 mg | SUBCUTANEOUS | 0 refills | Status: DC
  Filled 2024-08-08 – 2024-08-22 (×5): qty 2, 28d supply, fill #0

## 2024-08-09 ENCOUNTER — Other Ambulatory Visit (HOSPITAL_BASED_OUTPATIENT_CLINIC_OR_DEPARTMENT_OTHER): Payer: Self-pay

## 2024-08-09 ENCOUNTER — Other Ambulatory Visit: Payer: Self-pay

## 2024-08-22 ENCOUNTER — Other Ambulatory Visit: Payer: Self-pay

## 2024-08-22 ENCOUNTER — Other Ambulatory Visit (HOSPITAL_BASED_OUTPATIENT_CLINIC_OR_DEPARTMENT_OTHER): Payer: Self-pay

## 2024-08-31 ENCOUNTER — Other Ambulatory Visit: Payer: Self-pay

## 2024-08-31 ENCOUNTER — Other Ambulatory Visit (HOSPITAL_BASED_OUTPATIENT_CLINIC_OR_DEPARTMENT_OTHER): Payer: Self-pay

## 2024-08-31 MED ORDER — AMPHETAMINE-DEXTROAMPHET ER 30 MG PO CP24
30.0000 mg | ORAL_CAPSULE | Freq: Every morning | ORAL | 0 refills | Status: DC
  Filled 2024-08-31: qty 30, 30d supply, fill #0

## 2024-08-31 MED ORDER — CLONAZEPAM 0.5 MG PO TABS
0.5000 mg | ORAL_TABLET | Freq: Every day | ORAL | 0 refills | Status: DC | PRN
  Filled 2024-08-31: qty 30, 30d supply, fill #0

## 2024-08-31 MED ORDER — AMPHETAMINE-DEXTROAMPHETAMINE 10 MG PO TABS
10.0000 mg | ORAL_TABLET | Freq: Every day | ORAL | 0 refills | Status: DC
  Filled 2024-08-31: qty 30, 30d supply, fill #0

## 2024-08-31 MED ORDER — MELOXICAM 15 MG PO TABS
15.0000 mg | ORAL_TABLET | Freq: Every day | ORAL | 0 refills | Status: DC
  Filled 2024-08-31: qty 30, 30d supply, fill #0

## 2024-09-19 ENCOUNTER — Other Ambulatory Visit (HOSPITAL_BASED_OUTPATIENT_CLINIC_OR_DEPARTMENT_OTHER): Payer: Self-pay

## 2024-09-20 ENCOUNTER — Other Ambulatory Visit (HOSPITAL_BASED_OUTPATIENT_CLINIC_OR_DEPARTMENT_OTHER): Payer: Self-pay

## 2024-09-20 MED ORDER — ZEPBOUND 10 MG/0.5ML ~~LOC~~ SOAJ
10.0000 mg | SUBCUTANEOUS | 0 refills | Status: DC
  Filled 2024-09-20: qty 2, 28d supply, fill #0

## 2024-09-27 ENCOUNTER — Ambulatory Visit

## 2024-09-27 VITALS — BP 120/78 | HR 101 | Ht 72.0 in | Wt 250.0 lb

## 2024-09-27 DIAGNOSIS — Z6833 Body mass index (BMI) 33.0-33.9, adult: Secondary | ICD-10-CM

## 2024-09-27 DIAGNOSIS — G4733 Obstructive sleep apnea (adult) (pediatric): Secondary | ICD-10-CM | POA: Diagnosis not present

## 2024-09-27 DIAGNOSIS — E66812 Obesity, class 2: Secondary | ICD-10-CM

## 2024-09-27 NOTE — Patient Instructions (Signed)
 Notification of test results are managed in the following manner: If there are any recommendations or changes to the plan of care discussed in office today, we will contact you and let you know what they are. If you do not hear from us , then your results are normal/expected and you can view them through your MyChart account, or a letter will be sent to you. Thank you again for trusting us  with your care [facility].

## 2024-09-27 NOTE — Progress Notes (Signed)
 New Patient Pulmonology Office Visit   Subjective:  Patient ID: Peter Cline, male    DOB: 1976/10/20  MRN: [id number]  Referred by: Jacques Camie Pepper, P*  CC: No chief complaint on file.   HPI Peter Cline is a 48 y.o. male non smoker w ADD, depression, hyperlipidemia, testosterone  deficiency, hyperlipidemia.  Presents for follow up for mild OSA.   OSA history: Mild OSA dx in 2024. Started on CPAP.   Seen on 05/15/2024 by Np Almarie ferrari. Doing well on CPAP. Was changed to CPAP 9 cm H2O.   Currently using FFM. Tolerating it well.  Cleaning machine and putting filtered water in machine.  Post CPAP his energy is great, no snoring, apnea.    Sleep routine:  -Bed: 9.30p. onset quick.  -Nocturnal awakenings: 0 -Wake: 4.15 a -Napping:no -sleep hygiene: watching TV and then heading to sleep.   Social Hist/Habits:  -Caffeine: no -Alcohol: no -Nicotine:social smoker. Now quit.  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   PAP download compliance data: [employer].  Encore/Airview resmed 11.  Pressure: 9 cm H2o.  Hours of usage: 7 hrs.  Days used >4hr: 30/30 Leak: no.  AHI:0.4  PRIOR TESTS and IMAGING: PSG/HSAT:  11/2023: AHI 11.6, desaturation 3 min. O2 nadir 86%.       10/25/2023    3:00 PM  Results of the Epworth flowsheet  Sitting and reading 2  Watching TV 3  Sitting, inactive in a public place (e.g. a theatre or a meeting) 1  As a passenger in a car for an hour without a break 1  Lying down to rest in the afternoon when circumstances permit 3  Sitting and talking to someone 1  Sitting quietly after a lunch without alcohol 2  In a car, while stopped for a few minutes in traffic 1  Total score 14    Allergies: Latex  Current Outpatient Medications:    amphetamine -dextroamphetamine  (ADDERALL  XR) 30 MG 24 hr capsule, Take 1 capsule (30 mg total) by mouth every morning., Disp: 30 capsule, Rfl: 0   amphetamine -dextroamphetamine   (ADDERALL ) 10 MG tablet, Take 1 tablet (10 mg total) by mouth daily., Disp: 30 tablet, Rfl: 0   buPROPion  (WELLBUTRIN  XL) 300 MG 24 hr tablet, Take 1 tablet (300 mg total) by mouth every morning., Disp: 90 tablet, Rfl: 1   citalopram  (CELEXA ) 40 MG tablet, Take 1 tablet (40 mg total) by mouth daily., Disp: 90 tablet, Rfl: 1   clonazePAM  (KLONOPIN ) 0.5 MG tablet, Take 1 tablet (0.5 mg total) by mouth daily as needed for anxiety., Disp: 30 tablet, Rfl: 0   ezetimibe  (ZETIA ) 10 MG tablet, Take 1 tablet (10 mg total) by mouth daily., Disp: 90 tablet, Rfl: 0   meloxicam  (MOBIC ) 15 MG tablet, Take 1 tablet (15 mg total) by mouth daily., Disp: 30 tablet, Rfl: 0   Na Sulfate-K Sulfate-Mg Sulf 17.5-3.13-1.6 GM/177ML SOLN, TAKE 177 MLS BY MOUTH 2 TIMES., Disp: 354 mL, Rfl: 0   pravastatin  (PRAVACHOL ) 40 MG tablet, Take 1 tablet (40 mg total) by mouth every evening., Disp: 90 tablet, Rfl: 1   testosterone  cypionate (DEPOTESTOSTERONE CYPIONATE) 200 MG/ML injection, Inject 1 mL (200 mg total) into the muscle every 14 (fourteen) days., Disp: 10 mL, Rfl: 0   tirzepatide  (ZEPBOUND ) 10 MG/0.5ML Pen, Inject 10 mg into the skin once a week., Disp: 2 mL, Rfl: 0   tirzepatide  (ZEPBOUND ) 7.5 MG/0.5ML Pen, Inject 7.5 mg into the skin once a week., Disp: 2  mL, Rfl: 0   Vitamin D , Ergocalciferol , (DRISDOL ) 1.25 MG (50000 UNIT) CAPS capsule, Take 1 capsule (50,000 Units total) by mouth every 7 (seven) days at 0900 for 12 weeks, Disp: 12 capsule, Rfl: 0 No past medical history on file. No past surgical history on file. No family history on file. Social History   Socioeconomic History   Marital status: Married    Spouse name: Not on file   Number of children: Not on file   Years of education: Not on file   Highest education level: Not on file  Occupational History   Not on file  Tobacco Use   Smoking status: Never   Smokeless tobacco: Never  Substance and Sexual Activity   Alcohol use: Yes   Drug use: No   Sexual  activity: Not on file  Other Topics Concern   Not on file  Social History Narrative   Not on file   Social Drivers of Health   Financial Resource Strain: Low Risk  (04/08/2024)   Received from [facility]   Overall Financial Resource Strain (CARDIA)    Difficulty of Paying Living Expenses: Not very hard  Food Insecurity: No Food Insecurity (04/08/2024)   Received from [facility]   Hunger Vital Sign    Within the past 12 months, you worried that your food would run out before you got the money to buy more.: Never true    Within the past 12 months, the food you bought just didn't last and you didn't have money to get more.: Never true  Transportation Needs: No Transportation Needs (04/08/2024)   Received from [facility] - Transportation    Lack of Transportation (Medical): No    Lack of Transportation (Non-Medical): No  Physical Activity: Insufficiently Active (04/08/2024)   Received from [facility]   Exercise Vital Sign    On average, how many days per week do you engage in moderate to strenuous exercise (like a brisk walk)?: 4 days    On average, how many minutes do you engage in exercise at this level?: 30 min  Stress: No Stress Concern Present (04/08/2024)   Received from [facility] - Occupational Stress Questionnaire    Feeling of Stress : Only a little  Social Connections: Socially Integrated (04/08/2024)   Received from [facility]   Social Network    How would you rate your social network (family, work, friends)?: Good participation with social networks  Intimate Partner Violence: Not At Risk (04/08/2024)   Received from [facility]   HITS    Over the last 12 months how often did your partner physically hurt you?: Never    Over the last 12 months how often did your partner insult you or talk down to you?: Never    Over the last 12 months how often did your partner threaten you with physical harm?: Never     Over the last 12 months how often did your partner scream or curse at you?: Never       Objective:  There were no vitals taken for this visit. BMI Readings from Last 3 Encounters:  05/15/24 35.89 kg/m  10/25/23 36.59 kg/m  03/06/23 35.64 kg/m    Physical Exam: CONSTITUTIONAL: NAD, well-appearing NASAL/OROPHARYNX:  Normal mucosa. No septal deviation. No hypertrophy of inferior turbinates. Modified Mallampati score 3. CV: RRR s1s2 nl, no murmurs  RESP: Clear to auscultation, normal respiratory effort   NEURO: CN II/XII grossly  intact PSYCH: Alert & oriented x 3, Euthymic, appropriate affect  Diagnostic Review:  Last metabolic panel Lab Results  Component Value Date   GLUCOSE 125 (H) 03/06/2023   NA 138 03/06/2023   K 3.7 03/06/2023   CL 104 03/06/2023   CO2 26 03/06/2023   BUN 11 03/06/2023   CREATININE 0.95 03/06/2023   GFRNONAA >60 03/06/2023   CALCIUM 9.2 03/06/2023   PROT 6.3 (L) 03/06/2023   ALBUMIN 4.4 03/06/2023   BILITOT 0.6 03/06/2023   ALKPHOS 55 03/06/2023   AST 14 (L) 03/06/2023   ALT 23 03/06/2023   ANIONGAP 8 03/06/2023         Assessment & Plan:   Assessment & Plan OSA (obstructive sleep apnea) Tolerating CPAP well. Continue with CPAP 9 cm H2O. Continue cleaning your CPAP regularly, using distilled water and the chamber.      He was counselled about not driving while drowsy which is common side effect of sleep related disorders.   No follow-ups on file.   I personally spent a total of 25 minutes in the care of the patient today including preparing to see the patient, getting/reviewing separately obtained history, performing a medically appropriate exam/evaluation, counseling and educating, placing orders, documenting clinical information in the EHR, independently interpreting results, and communicating results.   Derin Granquist, MD

## 2024-10-09 ENCOUNTER — Other Ambulatory Visit: Payer: Self-pay

## 2024-10-09 ENCOUNTER — Encounter (HOSPITAL_BASED_OUTPATIENT_CLINIC_OR_DEPARTMENT_OTHER): Payer: Self-pay

## 2024-10-09 ENCOUNTER — Other Ambulatory Visit (HOSPITAL_BASED_OUTPATIENT_CLINIC_OR_DEPARTMENT_OTHER): Payer: Self-pay

## 2024-10-09 MED ORDER — PRAVASTATIN SODIUM 80 MG PO TABS
80.0000 mg | ORAL_TABLET | Freq: Every morning | ORAL | 0 refills | Status: AC
  Filled 2024-10-09: qty 30, 30d supply, fill #0
  Filled 2024-11-16: qty 30, 30d supply, fill #1
  Filled 2024-12-16: qty 30, 30d supply, fill #2

## 2024-10-09 MED ORDER — CLONAZEPAM 0.5 MG PO TABS
0.5000 mg | ORAL_TABLET | Freq: Every day | ORAL | 0 refills | Status: DC | PRN
  Filled 2024-10-09: qty 30, 30d supply, fill #0

## 2024-10-09 MED ORDER — AMPHETAMINE-DEXTROAMPHETAMINE 10 MG PO TABS
10.0000 mg | ORAL_TABLET | Freq: Every day | ORAL | 0 refills | Status: DC
  Filled 2024-10-09: qty 30, 30d supply, fill #0

## 2024-10-09 MED ORDER — AMPHETAMINE-DEXTROAMPHET ER 30 MG PO CP24
30.0000 mg | ORAL_CAPSULE | Freq: Every morning | ORAL | 0 refills | Status: DC
  Filled 2024-10-09: qty 30, 30d supply, fill #0

## 2024-10-09 MED ORDER — MELOXICAM 15 MG PO TABS
15.0000 mg | ORAL_TABLET | Freq: Every day | ORAL | 0 refills | Status: DC
  Filled 2024-10-09: qty 30, 30d supply, fill #0

## 2024-10-15 ENCOUNTER — Other Ambulatory Visit (HOSPITAL_BASED_OUTPATIENT_CLINIC_OR_DEPARTMENT_OTHER): Payer: Self-pay

## 2024-10-15 MED ORDER — ZEPBOUND 10 MG/0.5ML ~~LOC~~ SOAJ
10.0000 mg | SUBCUTANEOUS | 0 refills | Status: DC
  Filled 2024-10-15: qty 2, 28d supply, fill #0

## 2024-10-16 ENCOUNTER — Other Ambulatory Visit: Payer: Self-pay

## 2024-11-16 ENCOUNTER — Other Ambulatory Visit (HOSPITAL_BASED_OUTPATIENT_CLINIC_OR_DEPARTMENT_OTHER): Payer: Self-pay

## 2024-11-16 ENCOUNTER — Other Ambulatory Visit: Payer: Self-pay

## 2024-11-16 MED ORDER — CLONAZEPAM 0.5 MG PO TABS
0.5000 mg | ORAL_TABLET | Freq: Every day | ORAL | 0 refills | Status: DC | PRN
  Filled 2024-11-16: qty 30, 30d supply, fill #0

## 2024-11-16 MED ORDER — VITAMIN D (ERGOCALCIFEROL) 1.25 MG (50000 UNIT) PO CAPS
50000.0000 [IU] | ORAL_CAPSULE | ORAL | 0 refills | Status: AC
  Filled 2024-11-16: qty 4, 28d supply, fill #0
  Filled 2024-12-16: qty 4, 28d supply, fill #1

## 2024-11-16 MED ORDER — BUPROPION HCL ER (XL) 300 MG PO TB24
300.0000 mg | ORAL_TABLET | Freq: Every morning | ORAL | 1 refills | Status: AC
  Filled 2024-11-16: qty 30, 30d supply, fill #0
  Filled 2024-12-16: qty 30, 30d supply, fill #1

## 2024-11-16 MED ORDER — MELOXICAM 15 MG PO TABS
15.0000 mg | ORAL_TABLET | Freq: Every day | ORAL | 0 refills | Status: DC
  Filled 2024-11-16: qty 30, 30d supply, fill #0

## 2024-11-16 MED ORDER — EZETIMIBE 10 MG PO TABS
10.0000 mg | ORAL_TABLET | Freq: Every day | ORAL | 0 refills | Status: AC
  Filled 2024-11-16: qty 30, 30d supply, fill #0
  Filled 2024-12-16: qty 30, 30d supply, fill #1

## 2024-11-16 MED ORDER — ZEPBOUND 10 MG/0.5ML ~~LOC~~ SOAJ
10.0000 mg | SUBCUTANEOUS | 0 refills | Status: DC
  Filled 2024-11-16: qty 2, 28d supply, fill #0

## 2024-11-16 MED ORDER — AMPHETAMINE-DEXTROAMPHET ER 30 MG PO CP24
30.0000 mg | ORAL_CAPSULE | Freq: Every morning | ORAL | 0 refills | Status: DC
  Filled 2024-11-16: qty 30, 30d supply, fill #0

## 2024-11-16 MED ORDER — TESTOSTERONE CYPIONATE 200 MG/ML IM SOLN
200.0000 mg | INTRAMUSCULAR | 0 refills | Status: AC
  Filled 2024-11-16: qty 2, 28d supply, fill #0
  Filled 2024-12-16: qty 2, 28d supply, fill #1

## 2024-11-16 MED ORDER — CITALOPRAM HYDROBROMIDE 40 MG PO TABS
40.0000 mg | ORAL_TABLET | Freq: Every day | ORAL | 1 refills | Status: DC
  Filled 2024-11-16: qty 30, 30d supply, fill #0
  Filled 2024-12-16: qty 30, 30d supply, fill #1

## 2024-11-16 MED ORDER — AMPHETAMINE-DEXTROAMPHETAMINE 10 MG PO TABS
10.0000 mg | ORAL_TABLET | Freq: Every day | ORAL | 0 refills | Status: DC
  Filled 2024-11-16: qty 30, 30d supply, fill #0

## 2024-11-19 ENCOUNTER — Other Ambulatory Visit: Payer: Self-pay

## 2024-11-21 ENCOUNTER — Other Ambulatory Visit (HOSPITAL_BASED_OUTPATIENT_CLINIC_OR_DEPARTMENT_OTHER): Payer: Self-pay

## 2024-12-16 ENCOUNTER — Other Ambulatory Visit (HOSPITAL_BASED_OUTPATIENT_CLINIC_OR_DEPARTMENT_OTHER): Payer: Self-pay

## 2024-12-17 ENCOUNTER — Other Ambulatory Visit (HOSPITAL_BASED_OUTPATIENT_CLINIC_OR_DEPARTMENT_OTHER): Payer: Self-pay

## 2024-12-17 ENCOUNTER — Other Ambulatory Visit: Payer: Self-pay

## 2024-12-17 ENCOUNTER — Encounter (HOSPITAL_BASED_OUTPATIENT_CLINIC_OR_DEPARTMENT_OTHER): Payer: Self-pay

## 2024-12-17 MED ORDER — AMPHETAMINE-DEXTROAMPHETAMINE 10 MG PO TABS
10.0000 mg | ORAL_TABLET | Freq: Every day | ORAL | 0 refills | Status: AC
  Filled 2024-12-17 – 2024-12-19 (×2): qty 30, 30d supply, fill #0

## 2024-12-17 MED ORDER — CLONAZEPAM 0.5 MG PO TABS
0.5000 mg | ORAL_TABLET | Freq: Every day | ORAL | 0 refills | Status: AC | PRN
  Filled 2024-12-17 – 2024-12-19 (×2): qty 30, 30d supply, fill #0

## 2024-12-17 MED ORDER — MELOXICAM 15 MG PO TABS
15.0000 mg | ORAL_TABLET | Freq: Every day | ORAL | 0 refills | Status: AC
  Filled 2024-12-17: qty 30, 30d supply, fill #0

## 2024-12-17 MED ORDER — PRAVASTATIN SODIUM 80 MG PO TABS
80.0000 mg | ORAL_TABLET | Freq: Every evening | ORAL | 0 refills | Status: AC
  Filled 2024-12-17 – 2025-01-09 (×2): qty 30, 30d supply, fill #0

## 2024-12-17 MED ORDER — ZEPBOUND 10 MG/0.5ML ~~LOC~~ SOAJ
10.0000 mg | SUBCUTANEOUS | 0 refills | Status: DC
  Filled 2024-12-17 (×2): qty 2, 28d supply, fill #0

## 2024-12-17 MED ORDER — AMPHETAMINE-DEXTROAMPHET ER 30 MG PO CP24
30.0000 mg | ORAL_CAPSULE | Freq: Every morning | ORAL | 0 refills | Status: AC
  Filled 2024-12-17 – 2024-12-19 (×2): qty 30, 30d supply, fill #0

## 2024-12-19 ENCOUNTER — Other Ambulatory Visit (HOSPITAL_BASED_OUTPATIENT_CLINIC_OR_DEPARTMENT_OTHER): Payer: Self-pay

## 2024-12-20 ENCOUNTER — Other Ambulatory Visit (HOSPITAL_BASED_OUTPATIENT_CLINIC_OR_DEPARTMENT_OTHER): Payer: Self-pay

## 2024-12-21 ENCOUNTER — Encounter (HOSPITAL_BASED_OUTPATIENT_CLINIC_OR_DEPARTMENT_OTHER): Payer: Self-pay

## 2024-12-21 ENCOUNTER — Other Ambulatory Visit (HOSPITAL_BASED_OUTPATIENT_CLINIC_OR_DEPARTMENT_OTHER): Payer: Self-pay

## 2024-12-25 ENCOUNTER — Other Ambulatory Visit: Payer: Self-pay

## 2024-12-25 ENCOUNTER — Other Ambulatory Visit (HOSPITAL_BASED_OUTPATIENT_CLINIC_OR_DEPARTMENT_OTHER): Payer: Self-pay

## 2024-12-25 MED ORDER — CITALOPRAM HYDROBROMIDE 20 MG PO TABS
20.0000 mg | ORAL_TABLET | Freq: Every day | ORAL | 0 refills | Status: AC
  Filled 2024-12-25 – 2025-01-09 (×2): qty 30, 30d supply, fill #0

## 2024-12-25 MED ORDER — ZEPBOUND 12.5 MG/0.5ML ~~LOC~~ SOAJ
12.5000 mg | SUBCUTANEOUS | 0 refills | Status: AC
  Filled 2024-12-25 – 2025-01-10 (×3): qty 2, 28d supply, fill #0

## 2025-01-07 ENCOUNTER — Other Ambulatory Visit (HOSPITAL_BASED_OUTPATIENT_CLINIC_OR_DEPARTMENT_OTHER): Payer: Self-pay

## 2025-01-09 ENCOUNTER — Other Ambulatory Visit (HOSPITAL_BASED_OUTPATIENT_CLINIC_OR_DEPARTMENT_OTHER): Payer: Self-pay

## 2025-01-09 ENCOUNTER — Other Ambulatory Visit: Payer: Self-pay

## 2025-01-10 ENCOUNTER — Other Ambulatory Visit (HOSPITAL_BASED_OUTPATIENT_CLINIC_OR_DEPARTMENT_OTHER): Payer: Self-pay
